# Patient Record
Sex: Female | Born: 1970 | Race: White | Hispanic: No | Marital: Married | Smoking: Former smoker
Health system: Southern US, Community
[De-identification: ages and names within clinical notes are randomized; demographics above are authoritative.]

## PROBLEM LIST (undated history)

## (undated) DIAGNOSIS — E785 Hyperlipidemia, unspecified: Secondary | ICD-10-CM

## (undated) DIAGNOSIS — E559 Vitamin D deficiency, unspecified: Secondary | ICD-10-CM

## (undated) DIAGNOSIS — F419 Anxiety disorder, unspecified: Secondary | ICD-10-CM

## (undated) DIAGNOSIS — I10 Essential (primary) hypertension: Secondary | ICD-10-CM

## (undated) DIAGNOSIS — F32A Depression, unspecified: Secondary | ICD-10-CM

## (undated) HISTORY — PX: CHOLECYSTECTOMY: SHX55

## (undated) HISTORY — DX: Hyperlipidemia, unspecified: E78.5

## (undated) HISTORY — PX: ABDOMINAL HYSTERECTOMY: SHX81

---

## 1996-03-07 HISTORY — PX: TUBAL LIGATION: SHX77

## 2001-06-29 ENCOUNTER — Encounter: Payer: Self-pay | Admitting: Family Medicine

## 2001-06-29 ENCOUNTER — Ambulatory Visit (HOSPITAL_COMMUNITY): Admission: RE | Admit: 2001-06-29 | Discharge: 2001-06-29 | Payer: Self-pay | Admitting: Family Medicine

## 2001-07-03 ENCOUNTER — Encounter: Payer: Self-pay | Admitting: General Surgery

## 2001-07-03 ENCOUNTER — Ambulatory Visit (HOSPITAL_COMMUNITY): Admission: RE | Admit: 2001-07-03 | Discharge: 2001-07-03 | Payer: Self-pay | Admitting: General Surgery

## 2005-08-26 ENCOUNTER — Ambulatory Visit (HOSPITAL_COMMUNITY): Admission: RE | Admit: 2005-08-26 | Discharge: 2005-08-26 | Payer: Self-pay | Admitting: Family Medicine

## 2006-06-14 ENCOUNTER — Emergency Department (HOSPITAL_COMMUNITY): Admission: EM | Admit: 2006-06-14 | Discharge: 2006-06-15 | Payer: Self-pay | Admitting: Emergency Medicine

## 2006-07-14 ENCOUNTER — Ambulatory Visit: Payer: Self-pay | Admitting: Obstetrics & Gynecology

## 2006-07-14 ENCOUNTER — Other Ambulatory Visit: Payer: Self-pay

## 2006-07-15 ENCOUNTER — Ambulatory Visit: Payer: Self-pay | Admitting: Obstetrics & Gynecology

## 2010-06-22 NOTE — H&P (Signed)
[facility]  Patient:    Margaret [name], Margaret [name] Visit Number: [id number] MRN: [id number]          Service Type: OUT Location: RAD Attending Physician:  Darlin Priestly Dictated by:   Franky Macho, M.D. Admit Date:  06/29/2001 Discharge Date: 06/29/2001   CC:         Colette Ribas, M.D.   History and Physical  AGE:  [age].  CHIEF COMPLAINT:  Cholecystitis, cholelithiasis.  HISTORY OF PRESENT ILLNESS:  The patient is a 40 year old white female who presents with right upper quadrant pain and nausea which have been intermittent over the past year.  She has right upper quadrant abdominal pain which radiates around to the right flank, nausea, bloating; minimal vomiting has been noted.  An ultrasound of the gallbladder was performed which revealed a single gallstones in the neck of the gallbladder with a mildly thickened gallbladder wall.  The common bile duct is noted to be at 6 mm.  She denies any fever, chills, or jaundice.  PAST MEDICAL HISTORY:  Unremarkable.  PAST SURGICAL HISTORY:  Tubal ligation.  CURRENT MEDICATIONS:  Prevacid.  ALLERGIES:  No known drug allergies.  REVIEW OF SYSTEMS:  Unremarkable.  PHYSICAL EXAMINATION:  GENERAL:  On physical examination, the patient is a well-developed, well-nourished white female in no acute distress.  VITAL SIGNS:  She is afebrile and vital signs are stable.  HEENT:  Examination reveals no scleral icterus.  LUNGS:  Lungs are clear to auscultation with equal breath sounds bilaterally.  HEART:  Examination reveals a regular rate and rhythm without S3, S4, or murmurs.  ABDOMEN:  The abdomen is soft with tenderness noted in the right upper quadrant to palpation.  No hepatosplenomegaly, masses, or herniae are identified.  IMPRESSION:  Cholecystitis, cholelithiasis.  PLAN:  The patient is scheduled to undergo a laparoscopic cholecystectomy on Jul 03, 2001.  The risks and benefits of  the procedure including bleeding, infection, hepatobiliary injury, and the possibility of an open procedure were fully explained to the patient, who gave informed consent. Dictated by:   Franky Macho, M.D. Attending Physician:  Darlin Priestly DD:  07/01/01 TD:  07/02/01 Job: [id number]

## 2010-06-22 NOTE — Op Note (Signed)
[facility]  Patient:    Margaret [name], Margaret [name] Visit Number: [id number] MRN: [id number]          Service Type: DSU Location: DAY Attending Physician:  Dalia Heading Dictated by:   Franky Macho, M.D. Proc. Date: 07/03/01 Admit Date:  07/03/2001   CC:         Dr. Phillips Odor   Operative Report  PREOPERATIVE DIAGNOSES:  Cholecystitis, cholelithiasis.  POSTOPERATIVE DIAGNOSES:  Cholecystitis, cholelithiasis.  PROCEDURE:  Laparoscopic cholecystectomy with cholangiograms.  SURGEON:  Franky Macho, M.D.  ASSISTANT:  Arna Snipe, M.D.  ANESTHESIA:  General endotracheal.  INDICATIONS FOR PROCEDURE:  The patient is a 40 year old white female who presents with biliary colic secondary to cholelithiasis. She also has a slightly dilated common bile duct at 6 mm. Her liver enzyme tests were within normal limits. The risks and benefits of the procedure including bleeding, infection, hepatobiliary injury and the possibility of an open procedure were fully explained to the patient, who gave informed consent.  DESCRIPTION OF PROCEDURE:  The patient was placed in the supine position. After induction of general endotracheal anesthesia, the abdomen was prepped and draped using the usual sterile technique with Betadine.  A supraumbilical incision was made down the fascia. A Veress needle was introduced into the abdominal cavity and confirmation of placement was done using the saline drop test. The abdomen was then insufflated to 16 mmHg pressure. An 11 mm trocar was introduced into the abdominal cavity under direct visualization without difficulty. The patient was placed in reverse Trendelenburg position and an additional 11 mm trocar was placed in the epigastric region and 5 mm trocars were placed in the right upper quadrant and right flank regions. The liver was inspected and noted to be within normal limits. The gallbladder was retracted superior and  laterally. The dissection was begun around the infundibulum of the gallbladder. The cystic duct was first identified. Its junction to the infundibulum fully identified. A single endoclip was placed proximally on the cystic duct. An incision was made in the cystic duct and a cholangiocatheter was then inserted. Under digital C-arm, cholangiograms were performed. The dye flowed freely into the duodenum. There was no hepatobiliary filling defect. The common bile duct did not appear to be significantly dilated. No choledocholithiasis was seen. The catheter was in the appropriate position in the cystic duct. The catheter was removed and additional clips were placed distally on the cystic duct and the cystic duct was divided. The cystic artery was likewise ligated and divided. The gallbladder was then freed away from the gallbladder fossa using Bovie electrocautery. The gallbladder was delivered through the epigastric trocar site using an endocatch bag without difficulty. Multiple stones were noted within the gallbladder lumen. The gallbladder fossa was inspected and no abnormal bleeding or bile leakage was noted. Surgicel was placed in the gallbladder fossa. The subhepatic space as well as right hepatic gutter were irrigated with normal saline. All fluid and air were then evacuated from the abdominal cavity prior to removal of the trocars.  All wounds were irrigated with normal saline. All wounds were injected with 0.5% Marcaine. The supraumbilical fascia as well as epigastric fascia reapproximated using an #0 Vicryl interrupted suture. All skin incisions were closed using 4-0 Vicryl subcuticular sutures. Steri-Strips and dry sterile dressings were applied.  All tape and needle counts were correct at the end of the procedure. The patient was extubated in the operating room and went back to the recovery room awake in stable condition. Complications  none. Specimen, gallbladder with stones.  Blood loss minimal. Dictated by:   Franky Macho, M.D. Attending Physician:  Dalia Heading DD:  07/03/01 TD:  07/04/01 Job: [id number]

## 2012-01-21 ENCOUNTER — Emergency Department (HOSPITAL_COMMUNITY)
Admission: EM | Admit: 2012-01-21 | Discharge: 2012-01-21 | Disposition: A | Discharge status: Home / Self Care | Payer: Self-pay | Attending: Emergency Medicine | Admitting: Emergency Medicine

## 2012-01-21 ENCOUNTER — Encounter (HOSPITAL_COMMUNITY): Payer: Self-pay

## 2012-01-21 ENCOUNTER — Emergency Department (HOSPITAL_COMMUNITY): Payer: Self-pay

## 2012-01-21 DIAGNOSIS — Z79899 Other long term (current) drug therapy: Secondary | ICD-10-CM | POA: Insufficient documentation

## 2012-01-21 DIAGNOSIS — M2550 Pain in unspecified joint: Secondary | ICD-10-CM | POA: Insufficient documentation

## 2012-01-21 DIAGNOSIS — F411 Generalized anxiety disorder: Secondary | ICD-10-CM | POA: Insufficient documentation

## 2012-01-21 DIAGNOSIS — IMO0001 Reserved for inherently not codable concepts without codable children: Secondary | ICD-10-CM | POA: Insufficient documentation

## 2012-01-21 DIAGNOSIS — F17200 Nicotine dependence, unspecified, uncomplicated: Secondary | ICD-10-CM | POA: Insufficient documentation

## 2012-01-21 DIAGNOSIS — M79605 Pain in left leg: Secondary | ICD-10-CM

## 2012-01-21 DIAGNOSIS — M79609 Pain in unspecified limb: Secondary | ICD-10-CM | POA: Insufficient documentation

## 2012-01-21 DIAGNOSIS — I10 Essential (primary) hypertension: Secondary | ICD-10-CM | POA: Insufficient documentation

## 2012-01-21 DIAGNOSIS — R079 Chest pain, unspecified: Secondary | ICD-10-CM | POA: Insufficient documentation

## 2012-01-21 HISTORY — DX: Anxiety disorder, unspecified: F41.9

## 2012-01-21 HISTORY — DX: Essential (primary) hypertension: I10

## 2012-01-21 LAB — TROPONIN I: Troponin I: <0.3 ng/mL (ref <0.30)

## 2012-01-21 LAB — CBC WITH DIFFERENTIAL/PLATELET
Basophils Absolute: 0 10*3/uL (ref 0.0–0.1)
Eosinophils Absolute: 0.1 10*3/uL (ref 0.0–0.7)
Eosinophils Relative: 1 % (ref 0–5)
Lymphocytes Relative: 28 % (ref 12–46)
MCH: 31.3 pg (ref 26.0–34.0)
MCV: 89.5 fL (ref 78.0–100.0)
Neutrophils Relative %: 66 % (ref 43–77)
Platelets: 231 10*3/uL (ref 150–400)
RDW: 13.1 % (ref 11.5–15.5)
WBC: 7.1 10*3/uL (ref 4.0–10.5)

## 2012-01-21 LAB — BASIC METABOLIC PANEL
Calcium: 10.3 mg/dL (ref 8.4–10.5)
GFR calc Af Amer: >90 mL/min (ref >90)
GFR calc non Af Amer: >90 mL/min (ref >90)
Potassium: 4.2 mEq/L (ref 3.5–5.1)
Sodium: 137 mEq/L (ref 135–145)

## 2012-01-21 LAB — PROTIME-INR: Prothrombin Time: 12.2 seconds (ref 11.6–15.2)

## 2012-01-21 MED ORDER — CYCLOBENZAPRINE HCL 10 MG PO TABS: 10.0000 mg | ORAL_TABLET | Freq: Three times a day (TID) | ORAL | Status: DC | PRN

## 2012-01-21 NOTE — ED Notes (Signed)
Bilateral LE pain, 10/10 sharp ache.  Faint bruising on tibia of R leg and lateral calf of L leg. Cannot recall any injury to legs.  Also c/o dyspnea w/mild exertion, chest tightness radiating to back which increases w/deep inhalation. Father died from DVT sequela.   Took one of husband's 7.5mg  Percocet for pain this morning w/minimal relief.

## 2012-01-21 NOTE — ED Provider Notes (Signed)
History     CSN: [id number]  Arrival date & time 01/21/12  1132   First MD Initiated Contact with Patient 01/21/12 1536      Chief Complaint  Patient presents with  . Leg Pain    (Consider location/radiation/quality/duration/timing/severity/associated sxs/prior treatment) HPI Comments: patient c/o bilateral LE pain and frequent bruising for several days.  States she has noticed several bruises to her legs but denies known injury.  States she has a "knot" to the front of her left lower leg over the area of one of the bruises.  Pain to the left leg is worse than right.  She denies known injury, bleeding disorders, hx of DVT, recent surgery, sedentary lifestyle, or any anticoagulants.  Patient also states she had a brief episode of sharp pain to the middle of her chest that radiated to her back that began just PTA. She reports some shortness of breath with onset of the pain, but symptoms have improved since onset.  She states that her father died from a DVT and she is afraid she may have one.    Patient is a 41 y.o. female presenting with leg pain. The history is provided by the patient.  Leg Pain  The incident occurred more than 2 days ago. There was no injury mechanism. The pain is present in the left leg and right leg. The quality of the pain is described as aching and throbbing. The pain is moderate. The pain has been constant since onset. Pertinent negatives include no numbness, no inability to bear weight, no loss of motion, no muscle weakness, no loss of sensation and no tingling. She reports no foreign bodies present. The symptoms are aggravated by activity. She has tried nothing for the symptoms. The treatment provided no relief.    Past Medical History  Diagnosis Date  . Hypertension   . Anxiety     Past Surgical History  Procedure Date  . Cholecystectomy   . Abdominal hysterectomy     No family history on file.  History  Substance Use Topics  . Smoking status: Current  Every Day Smoker  . Smokeless tobacco: Not on file  . Alcohol Use: No    OB History    Grav Para Term Preterm Abortions TAB SAB Ect Mult Living                  Review of Systems  Constitutional: Negative for fever, chills, activity change and appetite change.  HENT: Negative for neck pain and neck stiffness.   Respiratory: Negative for cough, chest tightness, shortness of breath and wheezing.   Cardiovascular: Positive for chest pain. Negative for palpitations and leg swelling.  Gastrointestinal: Negative for abdominal pain.  Musculoskeletal: Positive for myalgias and arthralgias. Negative for back pain and joint swelling.  Skin:       Bruising to both LE's  Neurological: Negative for dizziness, tingling, weakness, numbness and headaches.  Hematological: Negative for adenopathy. Bruises/bleeds easily.  All other systems reviewed and are negative.    Allergies  Review of patient's allergies indicates no known allergies.  Home Medications   Current Outpatient Rx  Name  Route  Sig  Dispense  Refill  . ALPRAZOLAM 1 MG PO TABS   Oral   Take 1 mg by mouth at bedtime as needed. Anxiety.         Marland Kitchen LOSARTAN POTASSIUM 50 MG PO TABS   Oral   Take 50 mg by mouth daily.  BP 142/83  Pulse 99  Temp 98.1 F (36.7 C) (Oral)  Resp 20  Ht 5\' 2"  (1.575 m)  Wt 131 lb 9 oz (59.676 kg)  BMI 24.06 kg/m2  SpO2 100%  Physical Exam  Nursing note and vitals reviewed. Constitutional: She is oriented to person, place, and time. She appears well-developed and well-nourished. No distress.  HENT:  Head: Normocephalic and atraumatic.  Neck: Normal range of motion. Neck supple.  Cardiovascular: Normal rate, regular rhythm, normal heart sounds and intact distal pulses.   No murmur heard. Pulmonary/Chest: Effort normal and breath sounds normal. No respiratory distress. She has no wheezes. She has no rales. She exhibits tenderness.  Abdominal: Soft. She exhibits no distension.  There is no tenderness.  Musculoskeletal: Normal range of motion. She exhibits tenderness. She exhibits no edema.       Left lower leg: She exhibits tenderness. She exhibits no bony tenderness, no swelling, no edema, no deformity and no laceration.       Legs:      Small bruise of the right anterior LE and medial knee.  Larger bruise to the anterior left LE.  ttp of the left posterior calf, pain reproduced with palpation and dorsiflexion of the left foot.  No erythema or edema. Pt has full ROM of LE's bilaterally. DP pulses are equal and brisk bilaterally, distal sensation intact.    Lymphadenopathy:    She has no cervical adenopathy.  Neurological: She is alert and oriented to person, place, and time. She exhibits normal muscle tone. Coordination normal.  Skin: Skin is warm and dry. No erythema.    ED Course  Procedures (including critical care time)  Results for orders placed during the hospital encounter of 01/21/12  CBC WITH DIFFERENTIAL      Component Value Range   WBC 7.1  4.0 - 10.5 K/uL   RBC 4.48  3.87 - 5.11 MIL/uL   Hemoglobin 14.0  12.0 - 15.0 g/dL   HCT 45.4  09.8 - 11.9 %   MCV 89.5  78.0 - 100.0 fL   MCH 31.3  26.0 - 34.0 pg   MCHC 34.9  30.0 - 36.0 g/dL   RDW 14.7  82.9 - 56.2 %   Platelets 231  150 - 400 K/uL   Neutrophils Relative 66  43 - 77 %   Neutro Abs 4.7  1.7 - 7.7 K/uL   Lymphocytes Relative 28  12 - 46 %   Lymphs Abs 2.0  0.7 - 4.0 K/uL   Monocytes Relative 5  3 - 12 %   Monocytes Absolute 0.4  0.1 - 1.0 K/uL   Eosinophils Relative 1  0 - 5 %   Eosinophils Absolute 0.1  0.0 - 0.7 K/uL   Basophils Relative 0  0 - 1 %   Basophils Absolute 0.0  0.0 - 0.1 K/uL  BASIC METABOLIC PANEL      Component Value Range   Sodium 137  135 - 145 mEq/L   Potassium 4.2  3.5 - 5.1 mEq/L   Chloride 106  96 - 112 mEq/L   CO2 25  19 - 32 mEq/L   Glucose, Bld 99  70 - 99 mg/dL   BUN 7  6 - 23 mg/dL   Creatinine, Ser 1.30  0.50 - 1.10 mg/dL   Calcium 86.5  8.4 - 78.4  mg/dL   GFR calc non Af Amer >90  >90 mL/min   GFR calc Af Amer >90  >90 mL/min  TROPONIN  I      Component Value Range   Troponin I <0.30  <0.30 ng/mL  PROTIME-INR      Component Value Range   Prothrombin Time 12.2  11.6 - 15.2 seconds   INR 0.91  0.00 - 1.49  APTT      Component Value Range   aPTT 30  24 - 37 seconds    US Venous Img Lower Unilateral Left  01/21/2012  *RADIOLOGY REPORT*  Clinical Data: Left leg pain, evaluate for DVT  LEFT LOWER EXTREMITY VENOUS DUPLEX ULTRASOUND  Technique:  Gray-scale sonography with graded compression, as well as color Doppler and duplex ultrasound were performed to evaluate the deep venous system of the lower extremity from the level of the common femoral vein through the popliteal and proximal calf veins. Spectral Doppler was utilized to evaluate flow at rest and with distal augmentation maneuvers.  Comparison:  None.  Findings:  Normal compressibility of the common femoral, superficial femoral, and popliteal veins is demonstrated, as well as the visualized proximal calf veins.  No filling defects to suggest DVT on grayscale or color Doppler imaging.  Doppler waveforms show normal direction of venous flow, normal respiratory phasicity and response to augmentation.  IMPRESSION: No evidence of deep vein thrombosis within the left lower extremity.   Original Report Authenticated By: Tacey Ruiz, MD         MDM      Date: 01/21/2012  Rate:95  Rhythm: normal sinus rhythm  QRS Axis: normal  Intervals: normal  ST/T Wave abnormalities: normal  Conduction Disutrbances:none  Narrative Interpretation:   Old EKG Reviewed: none available    EKG read by Dr. Shelly Coss  1:30 pm  Pt with ttp of the posterior LLE., no hx of previous DVT, erythema or edema of the LE.  DP pulses are equal and brisk.  I have discussed pt hx and care plan with EDP. Will order venous US and labs   3:10 pm  patient is feeling better. Ambulates with a steady gait. Vital signs  have normalized during ED stay patient is not tachypneic, hypoxic or tachycardic at this time.  No [place] evidence for DVT.  Wells score for PE is 0,  PERC score is negative. Clinical suspicion for PE is low.  Pt agrees to close f/u with her PMD in 1-2 days or to return to ER if her sx's worsen     3:40 pm  Patient seen by EDP, Dr. Adriana Simas, care plan discussed.  Prescribed: flexeril   Aijalon Demuro L. B[place], [place] 01/22/12 2034

## 2012-01-21 NOTE — ED Notes (Signed)
Complain of knot on left lower leg and pain in calf

## 2012-01-21 NOTE — ED Notes (Signed)
Patient with no complaints at this time. Respirations even and unlabored. Skin warm/dry. Discharge instructions reviewed with patient at this time. Patient given opportunity to voice concerns/ask questions. Patient discharged at this time and left Emergency Department with steady gait.   

## 2012-01-23 NOTE — ED Provider Notes (Signed)
Medical screening examination/treatment/procedure(s) were conducted as a shared visit with non-physician practitioner(s) and myself.  I personally evaluated the patient during the encounter.  No clinical evidence to suggest DVT or pulmonary embolus  Donnetta Hutching, MD 01/23/12 [phone]

## 2014-01-13 ENCOUNTER — Other Ambulatory Visit (HOSPITAL_COMMUNITY): Payer: Self-pay | Admitting: Endocrinology

## 2014-01-13 DIAGNOSIS — E0590 Thyrotoxicosis, unspecified without thyrotoxic crisis or storm: Secondary | ICD-10-CM

## 2014-01-18 ENCOUNTER — Encounter (HOSPITAL_COMMUNITY): Payer: BC Managed Care – PPO

## 2014-02-03 ENCOUNTER — Encounter (HOSPITAL_COMMUNITY)
Admission: RE | Admit: 2014-02-03 | Discharge: 2014-02-03 | Disposition: A | Discharge status: Home / Self Care | Payer: BC Managed Care – PPO | Source: Ambulatory Visit | Attending: Endocrinology | Admitting: Endocrinology

## 2014-02-03 DIAGNOSIS — E0590 Thyrotoxicosis, unspecified without thyrotoxic crisis or storm: Secondary | ICD-10-CM | POA: Insufficient documentation

## 2014-02-03 MED ORDER — TECHNETIUM TC 99M SESTAMIBI GENERIC - CARDIOLITE
25.0000 | Freq: Once | INTRAVENOUS | Status: AC | PRN
  Administered 2014-02-03: 25 via INTRAVENOUS

## 2014-02-23 ENCOUNTER — Other Ambulatory Visit (INDEPENDENT_AMBULATORY_CARE_PROVIDER_SITE_OTHER): Payer: Self-pay | Admitting: *Deleted

## 2014-02-23 ENCOUNTER — Other Ambulatory Visit (INDEPENDENT_AMBULATORY_CARE_PROVIDER_SITE_OTHER): Payer: Self-pay | Admitting: Surgery

## 2014-02-23 DIAGNOSIS — E042 Nontoxic multinodular goiter: Secondary | ICD-10-CM

## 2014-02-23 DIAGNOSIS — E213 Hyperparathyroidism, unspecified: Secondary | ICD-10-CM

## 2014-02-28 ENCOUNTER — Ambulatory Visit
Admission: RE | Admit: 2014-02-28 | Discharge: 2014-02-28 | Disposition: A | Discharge status: Home / Self Care | Payer: BLUE CROSS/BLUE SHIELD | Source: Ambulatory Visit | Attending: Surgery | Admitting: Surgery

## 2014-02-28 ENCOUNTER — Other Ambulatory Visit: Payer: Self-pay

## 2014-03-08 ENCOUNTER — Emergency Department (HOSPITAL_COMMUNITY)
Admission: EM | Admit: 2014-03-08 | Discharge: 2014-03-08 | Disposition: A | Discharge status: Home / Self Care | Payer: BLUE CROSS/BLUE SHIELD | Attending: Emergency Medicine | Admitting: Emergency Medicine

## 2014-03-08 ENCOUNTER — Encounter (HOSPITAL_COMMUNITY): Payer: Self-pay | Admitting: Emergency Medicine

## 2014-03-08 DIAGNOSIS — Z79899 Other long term (current) drug therapy: Secondary | ICD-10-CM | POA: Diagnosis not present

## 2014-03-08 DIAGNOSIS — F419 Anxiety disorder, unspecified: Secondary | ICD-10-CM | POA: Diagnosis not present

## 2014-03-08 DIAGNOSIS — I10 Essential (primary) hypertension: Secondary | ICD-10-CM | POA: Insufficient documentation

## 2014-03-08 DIAGNOSIS — Z8639 Personal history of other endocrine, nutritional and metabolic disease: Secondary | ICD-10-CM | POA: Diagnosis not present

## 2014-03-08 DIAGNOSIS — R002 Palpitations: Secondary | ICD-10-CM | POA: Diagnosis not present

## 2014-03-08 DIAGNOSIS — R000 Tachycardia, unspecified: Secondary | ICD-10-CM | POA: Diagnosis present

## 2014-03-08 DIAGNOSIS — Z720 Tobacco use: Secondary | ICD-10-CM | POA: Insufficient documentation

## 2014-03-08 HISTORY — DX: Hypercalcemia: E83.52

## 2014-03-08 HISTORY — DX: Vitamin D deficiency, unspecified: E55.9

## 2014-03-08 LAB — COMPREHENSIVE METABOLIC PANEL
ALBUMIN: 3.7 g/dL (ref 3.5–5.2)
ALK PHOS: 61 U/L (ref 39–117)
ALT: 23 U/L (ref 0–35)
AST: 21 U/L (ref 0–37)
BUN: 10 mg/dL (ref 6–23)
CO2: 27 mmol/L (ref 19–32)
CREATININE: 0.83 mg/dL (ref 0.50–1.10)
Calcium: 10.1 mg/dL (ref 8.4–10.5)
Chloride: 107 mmol/L (ref 96–112)
GFR, EST NON AFRICAN AMERICAN: 85 mL/min — AB (ref >90)
Glucose, Bld: 96 mg/dL (ref 70–99)
POTASSIUM: 4.1 mmol/L (ref 3.5–5.1)
Sodium: 135 mmol/L (ref 135–145)
Total Bilirubin: 0.4 mg/dL (ref 0.3–1.2)
Total Protein: 6.8 g/dL (ref 6.0–8.3)

## 2014-03-08 LAB — TROPONIN I: Troponin I: <0.03 ng/mL (ref <0.031)

## 2014-03-08 LAB — CBC WITH DIFFERENTIAL/PLATELET
BASOS ABS: 0 10*3/uL (ref 0.0–0.1)
BASOS PCT: 0 % (ref 0–1)
EOS ABS: 0.1 10*3/uL (ref 0.0–0.7)
EOS PCT: 1 % (ref 0–5)
HEMATOCRIT: 39.5 % (ref 36.0–46.0)
Hemoglobin: 13.8 g/dL (ref 12.0–15.0)
LYMPHS ABS: 1.9 10*3/uL (ref 0.7–4.0)
Lymphocytes Relative: 21 % (ref 12–46)
MCH: 30.7 pg (ref 26.0–34.0)
MCHC: 34.9 g/dL (ref 30.0–36.0)
MCV: 87.8 fL (ref 78.0–100.0)
MONO ABS: 0.5 10*3/uL (ref 0.1–1.0)
MONOS PCT: 5 % (ref 3–12)
NEUTROS PCT: 73 % (ref 43–77)
Neutro Abs: 6.7 10*3/uL (ref 1.7–7.7)
Platelets: 231 10*3/uL (ref 150–400)
RBC: 4.5 MIL/uL (ref 3.87–5.11)
RDW: 12.2 % (ref 11.5–15.5)
WBC: 9.3 10*3/uL (ref 4.0–10.5)

## 2014-03-08 LAB — MAGNESIUM: Magnesium: 2 mg/dL (ref 1.5–2.5)

## 2014-03-08 LAB — LIPASE, BLOOD: LIPASE: 24 U/L (ref 11–59)

## 2014-03-08 MED ORDER — IBUPROFEN 800 MG PO TABS
800.0000 mg | ORAL_TABLET | Freq: Once | ORAL | Status: AC
  Administered 2014-03-08: 800 mg via ORAL
  Filled 2014-03-08: qty 1

## 2014-03-08 MED ORDER — ACETAMINOPHEN 500 MG PO TABS
1000.0000 mg | ORAL_TABLET | Freq: Once | ORAL | Status: AC
  Administered 2014-03-08: 1000 mg via ORAL
  Filled 2014-03-08: qty 2

## 2014-03-08 NOTE — ED Provider Notes (Signed)
CSN: [id number]     Arrival date & time 03/08/14  [id number] History  This chart was scribed for Margaret HutchingBrian Zonia Caplin, MD by Milly JakobJohn Lee Graves, ED Scribe. The patient was seen in room APA06/APA06. Patient's care was started at 8:34 AM.   Chief Complaint  Patient presents with  . Generalized Body Aches  . Tachycardia   The history is provided by the patient. No language interpreter was used.   HPI Comments: Rogelio SeenStephanie Mervin is a 44 y.o. female with a history of HTN, and hypercalcemia who presents to the Emergency Department complaining of constant, aching, RUQ abdominal pain which began this morning. She additionally reports a rapid heart rate, nausea, and a headache. She denies taking medication for this at home. She denies SOB. She reports taking 50,000 units of vitamin D per week for her hypercalcemia. She states that her hypercalcemia was diagnosed on August 15th after routine blood work, and she had a negative workup of her parathyroid by ultrasound done by Dr. Simmie DaviesGurgin in [place]. She reports a history of cholecystectomy and hysterectomy. She does not drink alcohol.  PCP: Cassell SmilesFUSCO,LAWRENCE J., MD   Past Medical History  Diagnosis Date  . Hypertension   . Anxiety   . Vitamin D deficiency   . Hypercalcemia   . Hypercalcemia    Past Surgical History  Procedure Laterality Date  . Cholecystectomy    . Abdominal hysterectomy     No family history on file. History  Substance Use Topics  . Smoking status: Current Every Day Smoker -- 0.50 packs/day    Types: Cigarettes  . Smokeless tobacco: Not on file  . Alcohol Use: No   OB History    No data available     Review of Systems  Cardiovascular: Positive for palpitations.  Gastrointestinal: Positive for abdominal pain.   A complete 10 system review of systems was obtained and all systems are negative except as noted in the HPI and PMH.   Allergies  Review of patient's allergies indicates no known allergies.  Home Medications   Prior to  Admission medications   Medication Sig Start Date End Date Taking? Authorizing Provider  ALPRAZolam Prudy Feeler(XANAX) 1 MG tablet Take 1 mg by mouth 4 (four) times daily as needed for sleep. Anxiety.   Yes Historical Provider, MD  buPROPion (WELLBUTRIN XL) 300 MG 24 hr tablet Take 300 mg by mouth daily. 02/17/14  Yes Historical Provider, MD  losartan (COZAAR) 50 MG tablet Take 50 mg by mouth daily.   Yes Historical Provider, MD  Vitamin D, Ergocalciferol, (DRISDOL) 50000 UNITS CAPS capsule Take 50,000 Units by mouth every 7 (seven) days. 02/23/14  Yes Historical Provider, MD  cyclobenzaprine (FLEXERIL) 10 MG tablet Take 1 tablet (10 mg total) by mouth 3 (three) times daily as needed for muscle spasms. Patient not taking: Reported on 03/08/2014 01/21/12   Tammy L. Triplett, PA-C   Triage Vitals: BP 153/92 mmHg  Pulse 91  Temp(Src) 98.6 F (37 C) (Oral)  Resp 15  Ht 5\' 2"  (1.575 m)  Wt 148 lb (67.132 kg)  BMI 27.06 kg/m2  SpO2 98% Physical Exam  Constitutional: She is oriented to person, place, and time. She appears well-developed and well-nourished.  HENT:  Head: Normocephalic and atraumatic.  Mouth/Throat: Oropharynx is clear and moist.  Eyes: Conjunctivae and EOM are normal. Pupils are equal, round, and reactive to light.  Neck: Normal range of motion. Neck supple.  Cardiovascular: Normal rate, regular rhythm and normal heart sounds.   No murmur heard.  Pulmonary/Chest: Effort normal and breath sounds normal. No respiratory distress. She has no wheezes. She has no rales. She exhibits no tenderness.  Abdominal: Soft. Bowel sounds are normal.  Musculoskeletal: Normal range of motion.  Neurological: She is alert and oriented to person, place, and time.  Skin: Skin is warm and dry.  Psychiatric: She has a normal mood and affect. Her behavior is normal.  Nursing note and vitals reviewed.   ED Course  Procedures (including critical care time) DIAGNOSTIC STUDIES: Oxygen Saturation is 98% on room  air, normal by my interpretation.    COORDINATION OF CARE: 8:38 AM-Discussed treatment plan which includes lab work with pt at bedside and pt agreed to plan.   Results for orders placed or performed during the hospital encounter of 03/08/14  Comprehensive metabolic panel  Result Value Ref Range   Sodium 135 135 - 145 mmol/L   Potassium 4.1 3.5 - 5.1 mmol/L   Chloride 107 96 - 112 mmol/L   CO2 27 19 - 32 mmol/L   Glucose, Bld 96 70 - 99 mg/dL   BUN 10 6 - 23 mg/dL   Creatinine, Ser 9.56 0.50 - 1.10 mg/dL   Calcium 21.3 8.4 - 08.6 mg/dL   Total Protein 6.8 6.0 - 8.3 g/dL   Albumin 3.7 3.5 - 5.2 g/dL   AST 21 0 - 37 U/L   ALT 23 0 - 35 U/L   Alkaline Phosphatase 61 39 - 117 U/L   Total Bilirubin 0.4 0.3 - 1.2 mg/dL   GFR calc non Af Amer 85 (L) >90 mL/min   GFR calc Af Amer >90 >90 mL/min   Anion gap NOT CALCULATED 5 - 15  CBC with Differential/Platelet  Result Value Ref Range   WBC 9.3 4.0 - 10.5 K/uL   RBC 4.50 3.87 - 5.11 MIL/uL   Hemoglobin 13.8 12.0 - 15.0 g/dL   HCT 57.8 46.9 - 62.9 %   MCV 87.8 78.0 - 100.0 fL   MCH 30.7 26.0 - 34.0 pg   MCHC 34.9 30.0 - 36.0 g/dL   RDW 52.8 41.3 - 24.4 %   Platelets 231 150 - 400 K/uL   Neutrophils Relative % 73 43 - 77 %   Neutro Abs 6.7 1.7 - 7.7 K/uL   Lymphocytes Relative 21 12 - 46 %   Lymphs Abs 1.9 0.7 - 4.0 K/uL   Monocytes Relative 5 3 - 12 %   Monocytes Absolute 0.5 0.1 - 1.0 K/uL   Eosinophils Relative 1 0 - 5 %   Eosinophils Absolute 0.1 0.0 - 0.7 K/uL   Basophils Relative 0 0 - 1 %   Basophils Absolute 0.0 0.0 - 0.1 K/uL  Lipase, blood  Result Value Ref Range   Lipase 24 11 - 59 U/L  Magnesium  Result Value Ref Range   Magnesium 2.0 1.5 - 2.5 mg/dL  Troponin I  Result Value Ref Range   Troponin I <0.03 <0.031 ng/mL   US Soft Tissue Head/neck  02/28/2014   CLINICAL DATA:  thyromegaly on physical exam, hyperparathyroidism, elevated calcium  EXAM: THYROID ULTRASOUND  TECHNIQUE: Ultrasound examination of the  thyroid gland and adjacent soft tissues was performed.  COMPARISON:  Scintigraphy 02/03/2014  FINDINGS: Right thyroid lobe  Measurements: 61 x 16 x 21 mm. Single 7 x 4 x 3 mm hypoechoic nodule, mid lobe  Left thyroid lobe  Measurements: 54 x 13 x 18 mm.  2 mm cyst, superior pole  Isthmus  Thickness: 5 mm.  8 x 5 x 8 mm hypoechoic nodule, left of midline.  Lymphadenopathy  None visualized.  IMPRESSION: 1. Thyromegaly with 3 small nodules. Findings do not meet current consensus criteria for biopsy. Follow-up by clinical exam is recommended. If patient has known risk factors for thyroid carcinoma, consider follow-up ultrasound in 12 months. If patient is clinically hyperthyroid, consider nuclear medicine thyroid uptake and scan. This recommendation follows the consensus statement: Management of Thyroid Nodules Detected as [place]: Society of Radiologists in Ultrasound Consensus Conference Statement. Radiology 2005; [id number].   Electronically Signed   By: Oley Balm M.D.   On: 02/28/2014 09:29      EKG Interpretation   Date/Time:  Tuesday March 08 2014 08:00:35 EST Ventricular Rate:  87 PR Interval:  136 QRS Duration: 91 QT Interval:  369 QTC Calculation: 444 R Axis:   79 Text Interpretation:  Sinus rhythm Baseline wander in lead(s) V4 V5  Confirmed by Amandalynn Pitz  MD, Monisha Siebel ([id number]) on 03/08/2014 8:06:55 AM      MDM   Final diagnoses:  Palpitations   Normal physical exam. EKG, troponin, calcium, magnesium all normal. Patient has primary care follow-up. She is feeling much better at discharge.  I personally performed the services described in this documentation, which was scribed in my presence. The recorded information has been reviewed and is accurate.    Margaret Hutching, MD 03/08/14 [phone]

## 2014-03-08 NOTE — ED Notes (Signed)
Patient with c/o generalized body cramping and joint aching x 3 days. States she has felt her heart "racing" today.

## 2014-03-08 NOTE — ED Notes (Signed)
Patient with no complaints at this time. Respirations even and unlabored. Skin warm/dry. Discharge instructions reviewed with patient at this time. Patient given opportunity to voice concerns/ask questions. Patient discharged at this time and left Emergency Department with steady gait.   

## 2014-03-08 NOTE — Discharge Instructions (Signed)
Tests were normal including calcium and EKG. Follow-up your primary care doctor.

## 2014-03-08 NOTE — ED Notes (Signed)
MD at bedside. 

## 2014-10-21 ENCOUNTER — Emergency Department (HOSPITAL_COMMUNITY)
Admission: EM | Admit: 2014-10-21 | Discharge: 2014-10-21 | Disposition: A | Discharge status: Home / Self Care | Payer: BLUE CROSS/BLUE SHIELD | Attending: Emergency Medicine | Admitting: Emergency Medicine

## 2014-10-21 ENCOUNTER — Encounter (HOSPITAL_COMMUNITY): Payer: Self-pay

## 2014-10-21 ENCOUNTER — Emergency Department (HOSPITAL_COMMUNITY): Payer: BLUE CROSS/BLUE SHIELD

## 2014-10-21 DIAGNOSIS — Z79899 Other long term (current) drug therapy: Secondary | ICD-10-CM | POA: Insufficient documentation

## 2014-10-21 DIAGNOSIS — E559 Vitamin D deficiency, unspecified: Secondary | ICD-10-CM | POA: Insufficient documentation

## 2014-10-21 DIAGNOSIS — Z720 Tobacco use: Secondary | ICD-10-CM | POA: Insufficient documentation

## 2014-10-21 DIAGNOSIS — R2241 Localized swelling, mass and lump, right lower limb: Secondary | ICD-10-CM | POA: Insufficient documentation

## 2014-10-21 DIAGNOSIS — I10 Essential (primary) hypertension: Secondary | ICD-10-CM | POA: Insufficient documentation

## 2014-10-21 DIAGNOSIS — M7989 Other specified soft tissue disorders: Secondary | ICD-10-CM

## 2014-10-21 DIAGNOSIS — F419 Anxiety disorder, unspecified: Secondary | ICD-10-CM | POA: Insufficient documentation

## 2014-10-21 LAB — CBC WITH DIFFERENTIAL/PLATELET
BASOS ABS: 0 10*3/uL (ref 0.0–0.1)
BASOS PCT: 0 %
EOS PCT: 1 %
Eosinophils Absolute: 0.1 10*3/uL (ref 0.0–0.7)
HEMATOCRIT: 41.7 % (ref 36.0–46.0)
Hemoglobin: 14.5 g/dL (ref 12.0–15.0)
Lymphocytes Relative: 26 %
Lymphs Abs: 2.1 10*3/uL (ref 0.7–4.0)
MCH: 30.2 pg (ref 26.0–34.0)
MCHC: 34.8 g/dL (ref 30.0–36.0)
MCV: 86.9 fL (ref 78.0–100.0)
MONO ABS: 0.4 10*3/uL (ref 0.1–1.0)
MONOS PCT: 6 %
NEUTROS ABS: 5.4 10*3/uL (ref 1.7–7.7)
Neutrophils Relative %: 67 %
PLATELETS: 248 10*3/uL (ref 150–400)
RBC: 4.8 MIL/uL (ref 3.87–5.11)
RDW: 12.7 % (ref 11.5–15.5)
WBC: 8 10*3/uL (ref 4.0–10.5)

## 2014-10-21 LAB — BASIC METABOLIC PANEL
Anion gap: 4 — ABNORMAL LOW (ref 5–15)
BUN: 7 mg/dL (ref 6–20)
CALCIUM: 9.8 mg/dL (ref 8.9–10.3)
CO2: 28 mmol/L (ref 22–32)
CREATININE: 0.64 mg/dL (ref 0.44–1.00)
Chloride: 107 mmol/L (ref 101–111)
GLUCOSE: 97 mg/dL (ref 65–99)
Potassium: 3.9 mmol/L (ref 3.5–5.1)
Sodium: 139 mmol/L (ref 135–145)

## 2014-10-21 LAB — PROTIME-INR
INR: 0.98 (ref 0.00–1.49)
Prothrombin Time: 13.2 seconds (ref 11.6–15.2)

## 2014-10-21 MED ORDER — HYDROCODONE-ACETAMINOPHEN 5-325 MG PO TABS: 1.0000 | ORAL_TABLET | ORAL | Status: DC | PRN

## 2014-10-21 NOTE — ED Provider Notes (Signed)
CSN: [id number]     Arrival date & time 10/21/14  0931 History   First MD Initiated Contact with Patient 10/21/14 [id number]     Chief Complaint  Patient presents with  . Leg Pain     (Consider location/radiation/quality/duration/timing/severity/associated sxs/prior Treatment) The history is provided by the patient.   Margaret [name] is a 44 y.o. female presenting with right calf pain and swelling which started one week ago.  She denies trauma, muscle spasm or history of similar symptoms and has had no recent long periods of rest.  She does describe a strong family history of blood clots (father, grandmother and aunt).  She denies chest pain, sob, fevers or chills and has had no skin changes, but states the calf has felt hot intermittently.  She has taken ibuprofen without relief of pain.  She called her pcp who advised she come here for evaluation.  Past Medical History  Diagnosis Date  . Hypertension   . Anxiety   . Vitamin D deficiency   . Hypercalcemia   . Hypercalcemia    Past Surgical History  Procedure Laterality Date  . Cholecystectomy    . Abdominal hysterectomy     No family history on file. Social History  Substance Use Topics  . Smoking status: Current Every Day Smoker -- 0.50 packs/day    Types: Cigarettes  . Smokeless tobacco: None  . Alcohol Use: No   OB History    No data available     Review of Systems  Constitutional: Negative for fever.  HENT: Negative for congestion and sore throat.   Eyes: Negative.   Respiratory: Negative for chest tightness and shortness of breath.   Cardiovascular: Negative for chest pain and palpitations.  Gastrointestinal: Negative for nausea and abdominal pain.  Genitourinary: Negative.   Musculoskeletal: Positive for myalgias. Negative for joint swelling, arthralgias and neck pain.  Skin: Negative.  Negative for color change, rash and wound.  Neurological: Negative for dizziness, weakness, light-headedness, numbness and  headaches.  Psychiatric/Behavioral: Negative.       Allergies  Review of patient's allergies indicates no known allergies.  Home Medications   Prior to Admission medications   Medication Sig Start Date End Date Taking? Authorizing Aleister Lady  ALPRAZolam Prudy Feeler) 1 MG tablet Take 1-2 mg by mouth 4 (four) times daily as needed for anxiety or sleep. Anxiety.   Yes Historical Riyansh Gerstner, MD  ibuprofen (ADVIL,MOTRIN) 200 MG tablet Take 400 mg by mouth every 6 (six) hours as needed for moderate pain.   Yes Historical Monte Zinni, MD  losartan (COZAAR) 50 MG tablet Take 50 mg by mouth daily.   Yes Historical Travaughn Vue, MD  Vitamin D, Ergocalciferol, (DRISDOL) 50000 UNITS CAPS capsule Take 50,000 Units by mouth every 7 (seven) days. Take on Sunday. 02/23/14  Yes Historical Alsha Meland, MD  HYDROcodone-acetaminophen (NORCO/VICODIN) 5-325 MG per tablet Take 1 tablet by mouth every 4 (four) hours as needed. 10/21/14   Burgess Amor, PA-C   BP 163/101 mmHg  Pulse 84  Temp(Src) 97.8 F (36.6 C) (Oral)  Resp 16  Ht  (1.575 m)  Wt 150 lb (68.04 kg)  BMI 27.43 kg/m2  SpO2 97% Physical Exam  Constitutional: She appears well-developed and well-nourished.  HENT:  Head: Normocephalic and atraumatic.  Eyes: Conjunctivae are normal.  Neck: Normal range of motion.  Cardiovascular: Normal rate, regular rhythm, normal heart sounds and intact distal pulses.   Dorsalis pedal pulses are equal.  Pulmonary/Chest: Effort normal and breath sounds normal. She has no wheezes.  Abdominal: Soft. Bowel sounds are normal. There is no tenderness.  Musculoskeletal: Normal range of motion. She exhibits edema and tenderness.  Right calf and anterior tibia with visible increased edema, trace pitting anterior.  No palpable cords, no erythema or red streaking.  Sensation intact. Skin intact.  Neurological: She is alert.  Skin: Skin is warm and dry.  Psychiatric: She has a normal mood and affect.  Nursing note and vitals  reviewed.   ED Course  Procedures (including critical care time) Labs Review Labs Reviewed  BASIC METABOLIC PANEL - Abnormal; Notable for the following:    Anion gap 4 (*)    All other components within normal limits  CBC WITH DIFFERENTIAL/PLATELET  PROTIME-INR    Imaging Review US Venous Img Lower Unilateral Right  10/21/2014   CLINICAL DATA:  Calf swelling and pain.  Symptoms for 1 week.  EXAM: RIGHT LOWER EXTREMITY VENOUS DOPPLER ULTRASOUND  TECHNIQUE: Gray-scale sonography with graded compression, as well as color Doppler and duplex ultrasound were performed to evaluate the lower extremity deep venous systems from the level of the common femoral vein and including the common femoral, femoral, profunda femoral, popliteal and calf veins including the posterior tibial, peroneal and gastrocnemius veins when visible. The superficial great saphenous vein was also interrogated. Spectral Doppler was utilized to evaluate flow at rest and with distal augmentation maneuvers in the common femoral, femoral and popliteal veins.  COMPARISON:  None.  FINDINGS: Contralateral Common Femoral Vein: Respiratory phasicity is normal and symmetric with the symptomatic side. No evidence of thrombus. Normal compressibility.  Common Femoral Vein: No evidence of thrombus. Normal compressibility, respiratory phasicity and response to augmentation.  Saphenofemoral Junction: No evidence of thrombus. Normal compressibility and flow on color Doppler imaging.  Profunda Femoral Vein: No evidence of thrombus. Normal compressibility and flow on color Doppler imaging.  Femoral Vein: No evidence of thrombus. Normal compressibility, respiratory phasicity and response to augmentation.  Popliteal Vein: No evidence of thrombus. Normal compressibility, respiratory phasicity and response to augmentation.  Calf Veins: No evidence of thrombus. Normal compressibility and flow on color Doppler imaging.  Superficial Great Saphenous Vein: No  evidence of thrombus. Normal compressibility and flow on color Doppler imaging.  Venous Reflux:  None.  Other Findings:  None.  IMPRESSION: No evidence of RIGHT lower extremity deep venous thrombosis.   Electronically Signed   By: Elsie Stain M.D.   On: 10/21/2014 11:11   I have personally reviewed and evaluated these images and lab results as part of my medical decision-making.   EKG Interpretation None      MDM   Final diagnoses:  Calf swelling    Patients  labs reviewed.  Radiological studies were viewed, interpreted and considered during the medical decision making and disposition process. I agree with radiologists reading.  Results were also discussed with patient.   Patient was advised given her family history to take an aspirin daily.  Also discussed elevation and warm compresses to her calf several times daily.  Follow up with her PCP for recheck in one week.  Discussed patient and results with Dr. Fayrene Fearing.  Patient may need to have repeat ultrasound if her symptoms persist, but there is no evidence for DVT at this time.  Patient's blood pressure is elevated today, she was encouraged to avoid missing doses of her blood pressure medication.  She was prescribed hydrocodone for pain relief.  Cautioned regarding sedation.   Burgess Amor, PA-C 10/21/14 1717  Rolland Porter, MD 10/22/14 [phone]

## 2014-10-21 NOTE — ED Notes (Signed)
Pt c/o pain in r calf x 1 week.  Denies injury.  Ankle swollen.  Pedal pulse present, capillary refill wnl.

## 2014-10-21 NOTE — Discharge Instructions (Signed)
Peripheral Edema You have swelling in your legs (peripheral edema). This swelling is due to excess accumulation of salt and water in your body. Edema may be a sign of heart, kidney or liver disease, or a side effect of a medication. It may also be due to problems in the leg veins. Elevating your legs and using special support stockings may be very helpful, if the cause of the swelling is due to poor venous circulation. Avoid long periods of standing, whatever the cause. Treatment of edema depends on identifying the cause. Chips, pretzels, pickles and other salty foods should be avoided. Restricting salt in your diet is almost always needed. Water pills (diuretics) are often used to remove the excess salt and water from your body via urine. These medicines prevent the kidney from reabsorbing sodium. This increases urine flow. Diuretic treatment may also result in lowering of potassium levels in your body. Potassium supplements may be needed if you have to use diuretics daily. Daily weights can help you keep track of your progress in clearing your edema. You should call your caregiver for follow up care as recommended. SEEK IMMEDIATE MEDICAL CARE IF:   You have increased swelling, pain, redness, or heat in your legs.  You develop shortness of breath, especially when lying down.  You develop chest or abdominal pain, weakness, or fainting.  You have a fever. Document Released: 02/29/2004 Document Revised: 04/15/2011 Document Reviewed: 02/08/2009 [facility] Patient Information 2015 [place], [place]. This information is not intended to replace advice given to you by your health care provider. Make sure you discuss any questions you have with your health care provider.   Your lab tests and ultrasound today are normal with no sign of a blood clot or superficial thrombophlebitis which is a condition that can also cause similar symptoms.  I suggest taking an aspirin daily, applying a heating pad 20 minutes 3  times daily,  Elevating your leg and following up with Dr. Sherwood Gambler in 1 week if not resolved.

## 2015-11-27 IMAGING — NM NM PARATHYROID W/ SPECT
4 series · 24 of 24 positions shown · non-contrast
Comparison: None.

CLINICAL DATA: Hyperparathyroidism. Evaluate for parathyroid
adenoma.

EXAM:
NM PARATHYROID SCINTIGRAPHY AND SPECT IMAGING
TECHNIQUE: Following intravenous administration of radiopharmaceutical, early
and 2-hour delayed planar images were obtained in the anterior
projection. Delayed triplanar SPECT images were also obtained at 2
hours.
RADIOPHARMACEUTICALS:  25.0 [id number] Sestamibi IV

[Series 1: pa parathyroid · 4.7mm · 4.75mm/px · 6 of 91 frames shown (1 of 4)]
[frame 8/91]
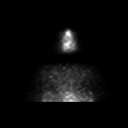
[frame 23/91]
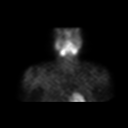
[frame 38/91]
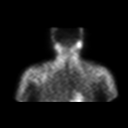
[frame 53/91]
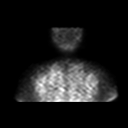
[frame 68/91]
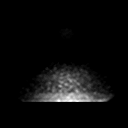
[frame 84/91]
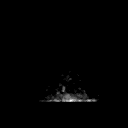

[Series 1: pa parathyroid · 4.7mm · 4.75mm/px · 6 of 91 frames shown (2 of 4)]
[frame 8/91]
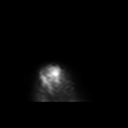
[frame 23/91]
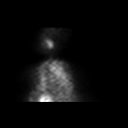
[frame 38/91]
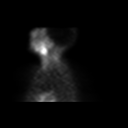
[frame 53/91]
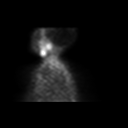
[frame 68/91]
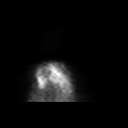
[frame 84/91]
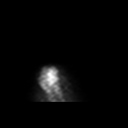

[Series 1: pa parathyroid · 4.7mm · 4.75mm/px · 6 of 91 frames shown (3 of 4)]
[frame 8/91]
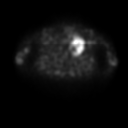
[frame 23/91]
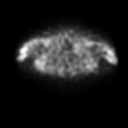
[frame 38/91]
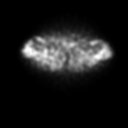
[frame 53/91]
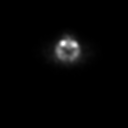
[frame 68/91]
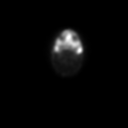
[frame 84/91]
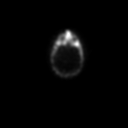

[Series 1: pa parathyroid · 4.75mm/px · 6 of 64 frames shown (4 of 4)]
[frame 6/64]
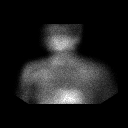
[frame 16/64]
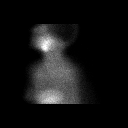
[frame 27/64]
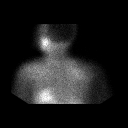
[frame 38/64]
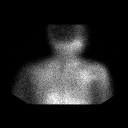
[frame 48/64]
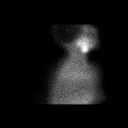
[frame 59/64]
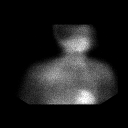

[24 of 24 positions shown; findings below may reference images not displayed]

FINDINGS: On the early images there is uniform tracer uptake by the thyroid
gland. On the washout images there is no abnormal persistent focus
of increased uptake to suggest parathyroid adenoma.
IMPRESSION: Examination is negative for parathyroid adenoma.

## 2016-05-15 ENCOUNTER — Other Ambulatory Visit (HOSPITAL_COMMUNITY): Payer: Self-pay | Admitting: Family Medicine

## 2016-05-15 ENCOUNTER — Ambulatory Visit (HOSPITAL_COMMUNITY)
Admission: RE | Admit: 2016-05-15 | Discharge: 2016-05-15 | Disposition: A | Discharge status: Home / Self Care | Payer: BLUE CROSS/BLUE SHIELD | Source: Ambulatory Visit | Attending: Family Medicine | Admitting: Family Medicine

## 2016-05-15 DIAGNOSIS — M546 Pain in thoracic spine: Secondary | ICD-10-CM | POA: Insufficient documentation

## 2019-10-06 HISTORY — PX: FINGER SURGERY: SHX640

## 2021-01-11 ENCOUNTER — Other Ambulatory Visit (HOSPITAL_COMMUNITY): Payer: Self-pay | Admitting: Family Medicine

## 2021-01-11 DIAGNOSIS — Z1231 Encounter for screening mammogram for malignant neoplasm of breast: Secondary | ICD-10-CM

## 2021-02-19 ENCOUNTER — Other Ambulatory Visit: Payer: Self-pay

## 2021-02-19 ENCOUNTER — Ambulatory Visit (HOSPITAL_COMMUNITY)
Admission: RE | Admit: 2021-02-19 | Discharge: 2021-02-19 | Disposition: A | Discharge status: Home / Self Care | Payer: 59 | Source: Ambulatory Visit | Attending: Family Medicine | Admitting: Family Medicine

## 2021-02-19 DIAGNOSIS — Z1231 Encounter for screening mammogram for malignant neoplasm of breast: Secondary | ICD-10-CM | POA: Diagnosis not present

## 2021-04-12 DIAGNOSIS — R69 Illness, unspecified: Secondary | ICD-10-CM | POA: Diagnosis not present

## 2021-04-25 ENCOUNTER — Telehealth: Payer: 59 | Admitting: Physician Assistant

## 2021-04-25 DIAGNOSIS — J0100 Acute maxillary sinusitis, unspecified: Secondary | ICD-10-CM

## 2021-04-25 MED ORDER — FLUTICASONE PROPIONATE 50 MCG/ACT NA SUSP: 2.0000 | Freq: Every day | NASAL | 0 refills | Status: DC

## 2021-04-25 MED ORDER — AMOXICILLIN-POT CLAVULANATE 875-125 MG PO TABS: 1.0000 | ORAL_TABLET | Freq: Two times a day (BID) | ORAL | 0 refills | Status: DC

## 2021-04-25 MED ORDER — BENZONATATE 100 MG PO CAPS: 100.0000 mg | ORAL_CAPSULE | Freq: Three times a day (TID) | ORAL | 0 refills | Status: DC | PRN

## 2021-04-25 NOTE — Patient Instructions (Signed)
?[name], thank you for joining [name], PA-C for today's virtual visit.  While this provider is not your primary care provider (PCP), if your PCP is located in our provider database this encounter information will be shared with them immediately following your visit. ? ?Consent: ?(Patient) Mahlia Fernando provided verbal consent for this virtual visit at the beginning of the encounter. ? ?Current Medications: ? ?Current Outpatient Medications:  ?  ALPRAZolam (XANAX) 1 MG tablet, Take 1-2 mg by mouth 4 (four) times daily as needed for anxiety or sleep. Anxiety., Disp: , Rfl:  ?  HYDROcodone-acetaminophen (NORCO/VICODIN) 5-325 MG per tablet, Take 1 tablet by mouth every 4 (four) hours as needed., Disp: 20 tablet, Rfl: 0 ?  ibuprofen (ADVIL,MOTRIN) 200 MG tablet, Take 400 mg by mouth every 6 (six) hours as needed for moderate pain., Disp: , Rfl:  ?  losartan (COZAAR) 50 MG tablet, Take 50 mg by mouth daily., Disp: , Rfl:  ?  Vitamin D, Ergocalciferol, (DRISDOL) 50000 UNITS CAPS capsule, Take 50,000 Units by mouth every 7 (seven) days. Take on Sunday., Disp: , Rfl: 0  ? ?Medications ordered in this encounter:  ?No orders of the defined types were placed in this encounter. ?  ? ?*If you need refills on other medications prior to your next appointment, please contact your pharmacy* ? ?Follow-Up: ?Call back or seek an in-person evaluation if the symptoms worsen or if the condition fails to improve as anticipated. ? ?Other Instructions ?Please take antibiotic as directed.  Increase fluid intake.  Use Saline nasal spray.  Take a daily multivitamin. Use the Tessalon and Flonase as directed.  Place a humidifier in the bedroom.  Please call or return clinic if symptoms are not improving. ? ?Sinusitis ?Sinusitis is redness, soreness, and swelling (inflammation) of the paranasal sinuses. Paranasal sinuses are air pockets within the bones of your face (beneath the eyes, the middle of the forehead, or above  the eyes). In healthy paranasal sinuses, mucus is able to drain out, and air is able to circulate through them by way of your nose. However, when your paranasal sinuses are inflamed, mucus and air can become trapped. This can allow bacteria and other germs to grow and cause infection. ?Sinusitis can develop quickly and last only a short time (acute) or continue over a long period (chronic). Sinusitis that lasts for more than 12 weeks is considered chronic.  ?CAUSES  ?Causes of sinusitis include: ?Allergies. ?Structural abnormalities, such as displacement of the cartilage that separates your nostrils (deviated septum), which can decrease the air flow through your nose and sinuses and affect sinus drainage. ?Functional abnormalities, such as when the small hairs (cilia) that line your sinuses and help remove mucus do not work properly or are not present. ?SYMPTOMS  ?Symptoms of acute and chronic sinusitis are the same. The primary symptoms are pain and pressure around the affected sinuses. Other symptoms include: ?Upper toothache. ?Earache. ?Headache. ?Bad breath. ?Decreased sense of smell and taste. ?A cough, which worsens when you are lying flat. ?Fatigue. ?Fever. ?Thick drainage from your nose, which often is green and may contain pus (purulent). ?Swelling and warmth over the affected sinuses. ?DIAGNOSIS  ?Your caregiver will perform a physical exam. During the exam, your caregiver may: ?Look in your nose for signs of abnormal growths in your nostrils (nasal polyps). ?Tap over the affected sinus to check for signs of infection. ?View the inside of your sinuses (endoscopy) with a special imaging device with a light attached (endoscope),  which is inserted into your sinuses. ?If your caregiver suspects that you have chronic sinusitis, one or more of the following tests may be recommended: ?Allergy tests. ?Nasal culture A sample of mucus is taken from your nose and sent to a lab and screened for bacteria. ?Nasal  cytology A sample of mucus is taken from your nose and examined by your caregiver to determine if your sinusitis is related to an allergy. ?TREATMENT  ?Most cases of acute sinusitis are related to a viral infection and will resolve on their own within 10 days. Sometimes medicines are prescribed to help relieve symptoms (pain medicine, decongestants, nasal steroid sprays, or saline sprays).  ?However, for sinusitis related to a bacterial infection, your caregiver will prescribe antibiotic medicines. These are medicines that will help kill the bacteria causing the infection.  ?Rarely, sinusitis is caused by a fungal infection. In theses cases, your caregiver will prescribe antifungal medicine. ?For some cases of chronic sinusitis, surgery is needed. Generally, these are cases in which sinusitis recurs more than 3 times per year, despite other treatments. ?HOME CARE INSTRUCTIONS  ?Drink plenty of water. Water helps thin the mucus so your sinuses can drain more easily. ?Use a humidifier. ?Inhale steam 3 to 4 times a day (for example, sit in the bathroom with the shower running). ?Apply a warm, moist washcloth to your face 3 to 4 times a day, or as directed by your caregiver. ?Use saline nasal sprays to help moisten and clean your sinuses. ?Take over-the-counter or prescription medicines for pain, discomfort, or fever only as directed by your caregiver. ?SEEK IMMEDIATE MEDICAL CARE IF: ?You have increasing pain or severe headaches. ?You have nausea, vomiting, or drowsiness. ?You have swelling around your face. ?You have vision problems. ?You have a stiff neck. ?You have difficulty breathing. ?MAKE SURE YOU:  ?Understand these instructions. ?Will watch your condition. ?Will get help right away if you are not doing well or get worse. ?Document Released: 01/21/2005 Document Revised: 04/15/2011 Document Reviewed: 02/05/2011 ?Exit[employer]? Patient Information ?2014 [place], [place]. ? ? ? ?If you have been instructed to have an  in-person evaluation today at a local Urgent Care facility, please use the link below. It will take you to a list of all of our available [facility], including address, phone number and hours of operation. Please do not delay care.  ?[facility] ? ?If you or a family member do not have a primary care provider, use the link below to schedule a visit and establish care. When you choose a [facility] primary care physician or advanced practice provider, you gain a long-term partner in health. ?Find a Primary Care Provider ? ?Learn more about [facility]'s in-office and virtual care options: ?[facility] - Get Care Now  ?

## 2021-04-25 NOTE — Progress Notes (Signed)
?Virtual Visit Consent  ? ?[name], you are scheduled for a virtual visit with a [facility] provider today.   ?  ?Just as with appointments in the office, your consent must be obtained to participate.  Your consent will be active for this visit and any virtual visit you may have with one of our providers in the next 365 days.   ?  ?If you have a MyChart account, a copy of this consent can be sent to you electronically.  All virtual visits are billed to your insurance company just like a traditional visit in the office.   ? ?As this is a virtual visit, video technology does not allow for your provider to perform a traditional examination.  This may limit your provider's ability to fully assess your condition.  If your provider identifies any concerns that need to be evaluated in person or the need to arrange testing (such as labs, EKG, etc.), we will make arrangements to do so.   ?  ?Although advances in technology are sophisticated, we cannot ensure that it will always work on either your end or our end.  If the connection with a video visit is poor, the visit may have to be switched to a telephone visit.  With either a video or telephone visit, we are not always able to ensure that we have a secure connection.    ? ?I need to obtain your verbal consent now.   Are you willing to proceed with your visit today?  ?  ?[name] has provided verbal consent on 04/25/2021 for a virtual visit (video or telephone). ?  ?[name], PA-C  ? ?Date: 04/25/2021 6:20 PM ? ? ?Virtual Visit via Video Note  ? ?IPiedad Climes, connected with  Pernell Dikes  ([id number], 1970/09/17) on 04/25/21 at  6:15 PM EDT by a video-enabled telemedicine application and verified that I am speaking with the correct person using two identifiers. ? ?Location: ?Patient: Virtual Visit Location Patient: Home ?Provider: Virtual Visit Location Provider: Home Office ?  ?I discussed the limitations of evaluation and management  by telemedicine and the availability of in person appointments. The patient expressed understanding and agreed to proceed.   ? ?History of Present Illness: ?Margaret [name] is a 51 y.o. who identifies as a female who was assigned female at birth, and is being seen today for for URI symptoms starting last week with nasal and head congestion, sore throat, cough that is now productive of yellow phlegm. Has had a fever with Tmax 101. Denies chest pain. Denies recent travel or sick contact. Has some grandchildren that have been sick. Is now having maxillary sinus pain.  Has been taking Advil Cold and Sinus.  ? ?HPI: HPI  ?Problems: There are no problems to display for this patient. ?  ?Allergies: No Known Allergies ?Medications:  ?Current Outpatient Medications:  ?  amoxicillin-clavulanate (AUGMENTIN) 875-125 MG tablet, Take 1 tablet by mouth 2 (two) times daily., Disp: 14 tablet, Rfl: 0 ?  benzonatate (TESSALON) 100 MG capsule, Take 1 capsule (100 mg total) by mouth 3 (three) times daily as needed for cough., Disp: 30 capsule, Rfl: 0 ?  fluticasone (FLONASE) 50 MCG/ACT nasal spray, Place 2 sprays into both nostrils daily., Disp: 16 g, Rfl: 0 ?  ALPRAZolam (XANAX) 1 MG tablet, Take 1-2 mg by mouth 4 (four) times daily as needed for anxiety or sleep. Anxiety., Disp: , Rfl:  ?  ibuprofen (ADVIL,MOTRIN) 200 MG tablet, Take 400 mg by  mouth every 6 (six) hours as needed for moderate pain., Disp: , Rfl:  ?  losartan (COZAAR) 50 MG tablet, Take 50 mg by mouth daily., Disp: , Rfl:  ?  Vitamin D, Ergocalciferol, (DRISDOL) 50000 UNITS CAPS capsule, Take 50,000 Units by mouth every 7 (seven) days. Take on Sunday., Disp: , Rfl: 0 ? ?Observations/Objective: ?Patient is well-developed, well-nourished in no acute distress.  ?Resting comfortably at home.  ?Head is normocephalic, atraumatic.  ?No labored breathing. ?Speech is clear and coherent with logical content.  ?Patient is alert and oriented at baseline.  ? ?Assessment and  Plan: ?1. Acute non-recurrent maxillary sinusitis ?- fluticasone (FLONASE) 50 MCG/ACT nasal spray; Place 2 sprays into both nostrils daily.  Dispense: 16 g; Refill: 0 ?- amoxicillin-clavulanate (AUGMENTIN) 875-125 MG tablet; Take 1 tablet by mouth 2 (two) times daily.  Dispense: 14 tablet; Refill: 0 ?- benzonatate (TESSALON) 100 MG capsule; Take 1 capsule (100 mg total) by mouth 3 (three) times daily as needed for cough.  Dispense: 30 capsule; Refill: 0 ? ?Rx Augmentin.  Increase fluids.  Rest.  Saline nasal spray.  Probiotic.  Mucinex as directed.  Humidifier in bedroom. Flonase and Tessalon per orders.  Call or return to clinic if symptoms are not improving. ? ? ?Follow Up Instructions: ?I discussed the assessment and treatment plan with the patient. The patient was provided an opportunity to ask questions and all were answered. The patient agreed with the plan and demonstrated an understanding of the instructions.  A copy of instructions were sent to the patient via MyChart unless otherwise noted below.  ? ?The patient was advised to call back or seek an in-person evaluation if the symptoms worsen or if the condition fails to improve as anticipated. ? ?Time:  ?I spent 15 minutes with the patient via telehealth technology discussing the above problems/concerns.   ? ?[name], PA-C ?

## 2021-07-25 DIAGNOSIS — I10 Essential (primary) hypertension: Secondary | ICD-10-CM | POA: Diagnosis not present

## 2021-07-25 DIAGNOSIS — E785 Hyperlipidemia, unspecified: Secondary | ICD-10-CM | POA: Diagnosis not present

## 2021-07-25 DIAGNOSIS — E663 Overweight: Secondary | ICD-10-CM | POA: Diagnosis not present

## 2021-08-27 ENCOUNTER — Telehealth: Payer: 59 | Admitting: Nurse Practitioner

## 2021-08-27 DIAGNOSIS — J0110 Acute frontal sinusitis, unspecified: Secondary | ICD-10-CM

## 2021-08-27 DIAGNOSIS — R051 Acute cough: Secondary | ICD-10-CM | POA: Diagnosis not present

## 2021-08-27 MED ORDER — BENZONATATE 100 MG PO CAPS: 100.0000 mg | ORAL_CAPSULE | Freq: Three times a day (TID) | ORAL | 0 refills | Status: DC | PRN

## 2021-08-27 MED ORDER — AMOXICILLIN-POT CLAVULANATE 875-125 MG PO TABS: 1.0000 | ORAL_TABLET | Freq: Two times a day (BID) | ORAL | 0 refills | Status: AC

## 2021-08-27 NOTE — Progress Notes (Signed)
Virtual Visit Consent   Margaret [name], you are scheduled for a virtual visit with a [facility] provider today. Just as with appointments in the office, your consent must be obtained to participate. Your consent will be active for this visit and any virtual visit you may have with one of our providers in the next 365 days. If you have a MyChart account, a copy of this consent can be sent to you electronically.  As this is a virtual visit, video technology does not allow for your provider to perform a traditional examination. This may limit your provider's ability to fully assess your condition. If your provider identifies any concerns that need to be evaluated in person or the need to arrange testing (such as labs, EKG, etc.), we will make arrangements to do so. Although advances in technology are sophisticated, we cannot ensure that it will always work on either your end or our end. If the connection with a video visit is poor, the visit may have to be switched to a telephone visit. With either a video or telephone visit, we are not always able to ensure that we have a secure connection.  By engaging in this virtual visit, you consent to the provision of healthcare and authorize for your insurance to be billed (if applicable) for the services provided during this visit. Depending on your insurance coverage, you may receive a charge related to this service.  I need to obtain your verbal consent now. Are you willing to proceed with your visit today? Margaret [name] has provided verbal consent on 08/27/2021 for a virtual visit (video or telephone). Viviano Simas, FNP  Date: 08/27/2021 11:31 AM  Virtual Visit via Video Note   I, Viviano Simas, connected with  Margaret [name]  ([id number], 10/03/1970) on 08/27/21 at 11:30 AM EDT by a video-enabled telemedicine application and verified that I am speaking with the correct person using two identifiers.  Location: Patient: Virtual Visit Location Patient:  Home Provider: Virtual Visit Location Provider: Home Office   I discussed the limitations of evaluation and management by telemedicine and the availability of in person appointments. The patient expressed understanding and agreed to proceed.    History of Present Illness: Margaret [name] is a 51 y.o. who identifies as a female who was assigned female at birth, and is being seen today with complaints of sinus congestion and this morning she woke up with her eye swollen with drainage and feeling like her nose is swollen as well.  She has post nasal drainage as well with a dry cough.   She has had a sinus infection in the past and feels this is similar, she at most gets two per year.   She does suffer from allergies and takes claritin daily.   She has been using Mucinex without relief.   She denies a fever    Problems: There are no problems to display for this patient.   Allergies: No Known Allergies Medications:  Current Outpatient Medications:    ALPRAZolam (XANAX) 1 MG tablet, Take 1-2 mg by mouth 4 (four) times daily as needed for anxiety or sleep. Anxiety., Disp: , Rfl:    amoxicillin-clavulanate (AUGMENTIN) 875-125 MG tablet, Take 1 tablet by mouth 2 (two) times daily., Disp: 14 tablet, Rfl: 0   benzonatate (TESSALON) 100 MG capsule, Take 1 capsule (100 mg total) by mouth 3 (three) times daily as needed for cough., Disp: 30 capsule, Rfl: 0   fluticasone (FLONASE) 50 MCG/ACT nasal spray, Place 2 sprays into  both nostrils daily., Disp: 16 g, Rfl: 0   ibuprofen (ADVIL,MOTRIN) 200 MG tablet, Take 400 mg by mouth every 6 (six) hours as needed for moderate pain., Disp: , Rfl:    losartan (COZAAR) 50 MG tablet, Take 50 mg by mouth daily., Disp: , Rfl:    Vitamin D, Ergocalciferol, (DRISDOL) 50000 UNITS CAPS capsule, Take 50,000 Units by mouth every 7 (seven) days. Take on Sunday., Disp: , Rfl: 0  Observations/Objective: Patient is well-developed, well-nourished in no acute distress.   Resting comfortably  at home.  Head is normocephalic, atraumatic.  No labored breathing.  Speech is clear and coherent with logical content.  Patient is alert and oriented at baseline.    Assessment and Plan: 1. Acute non-recurrent frontal sinusitis  - amoxicillin-clavulanate (AUGMENTIN) 875-125 MG tablet; Take 1 tablet by mouth 2 (two) times daily for 7 days. Take with food  Dispense: 14 tablet; Refill: 0  2. Acute cough  - benzonatate (TESSALON) 100 MG capsule; Take 1 capsule (100 mg total) by mouth 3 (three) times daily as needed.  Dispense: 30 capsule; Refill: 0     Follow Up Instructions: I discussed the assessment and treatment plan with the patient. The patient was provided an opportunity to ask questions and all were answered. The patient agreed with the plan and demonstrated an understanding of the instructions.  A copy of instructions were sent to the patient via MyChart unless otherwise noted below.    The patient was advised to call back or seek an in-person evaluation if the symptoms worsen or if the condition fails to improve as anticipated.  Time:  I spent 10 minutes with the patient via telehealth technology discussing the above problems/concerns.    Viviano Simas, FNP

## 2021-10-03 ENCOUNTER — Ambulatory Visit
Admission: RE | Admit: 2021-10-03 | Discharge: 2021-10-03 | Disposition: A | Discharge status: Home / Self Care | Payer: 59 | Source: Ambulatory Visit | Attending: Family Medicine | Admitting: Family Medicine

## 2021-10-03 VITALS — BP 114/80 | HR 88 | Temp 98.5°F | Resp 17

## 2021-10-03 DIAGNOSIS — H00011 Hordeolum externum right upper eyelid: Secondary | ICD-10-CM

## 2021-10-03 MED ORDER — ERYTHROMYCIN 5 MG/GM OP OINT: TOPICAL_OINTMENT | OPHTHALMIC | 0 refills | Status: DC

## 2021-10-03 NOTE — ED Provider Notes (Signed)
[facility]    CSN: [id number] Arrival date & time: 10/03/21  1211      History   Chief Complaint Chief Complaint  Patient presents with   Eye Problem    Swollen eye lid and pain. No redness - Entered by patient   Belepharitis   HPI Margaret [name] is a 51 y.o. female.   Pt presents with complaints of right eyelid pain and swelling x 3 days. No eye involvement denies vision trouble. Pt is also tender underneath her eye.      Past Medical History:  Diagnosis Date   Anxiety    Hypercalcemia    Hypercalcemia    Hypertension    Vitamin D deficiency    There are no problems to display for this patient.  Past Surgical History:  Procedure Laterality Date   ABDOMINAL HYSTERECTOMY     CHOLECYSTECTOMY     OB History   No obstetric history on file.     Home Medications    Prior to Admission medications   Medication Sig Start Date End Date Taking? Authorizing Provider  erythromycin ophthalmic ointment Place a 1/2 inch ribbon of ointment into the right lower eyelid BID prn. 10/03/21  Yes Particia Nearing, PA-C  ALPRAZolam Prudy Feeler) 1 MG tablet Take 1-2 mg by mouth 4 (four) times daily as needed for anxiety or sleep. Anxiety.    [provider]  amoxicillin-clavulanate (AUGMENTIN) 875-125 MG tablet Take 1 tablet by mouth 2 (two) times daily. 04/25/21   Waldon Merl, PA-C  benzonatate (TESSALON) 100 MG capsule Take 1 capsule (100 mg total) by mouth 3 (three) times daily as needed for cough. 04/25/21   Waldon Merl, PA-C  benzonatate (TESSALON) 100 MG capsule Take 1 capsule (100 mg total) by mouth 3 (three) times daily as needed. 08/27/21   Viviano Simas, FNP  fluticasone (FLONASE) 50 MCG/ACT nasal spray Place 2 sprays into both nostrils daily. 04/25/21   Waldon Merl, PA-C  ibuprofen (ADVIL,MOTRIN) 200 MG tablet Take 400 mg by mouth every 6 (six) hours as needed for moderate pain.    [provider]  losartan (COZAAR) 50 MG tablet  Take 50 mg by mouth daily.    [provider]  Vitamin D, Ergocalciferol, (DRISDOL) 50000 UNITS CAPS capsule Take 50,000 Units by mouth every 7 (seven) days. Take on Sunday. 02/23/14   [provider]   Family History Family History  Problem Relation Age of Onset   COPD Mother    Social History Social History   Tobacco Use   Smoking status: Every Day    Packs/day: 0.50    Types: Cigarettes  Substance Use Topics   Alcohol use: No   Drug use: No   Allergies   Patient has no known allergies.  Review of Systems Review of Systems PER HPI  Physical Exam Triage Vital Signs ED Triage Vitals  Enc Vitals Group     BP 10/03/21 1220 114/80     Pulse Rate 10/03/21 1220 88     Resp 10/03/21 1220 17     Temp 10/03/21 1220 98.5 F (36.9 C)     Temp src --      SpO2 10/03/21 1220 97 %     Weight --      Height --      Head Circumference --      Peak Flow --      Pain Score 10/03/21 1219 7     Pain Loc --  Pain Edu? --      Excl. in GC? --    No data found.  Updated Vital Signs BP 114/80   Pulse 88   Temp 98.5 F (36.9 C)   Resp 17   SpO2 97%   Visual Acuity Right Eye Distance:   Left Eye Distance:   Bilateral Distance:    Right Eye Near:   Left Eye Near:    Bilateral Near:     Physical Exam Vitals and nursing note reviewed.  Constitutional:      Appearance: Normal appearance. She is not ill-appearing.  HENT:     Head: Atraumatic.     Mouth/Throat:     Mouth: Mucous membranes are moist.  Eyes:     Extraocular Movements: Extraocular movements intact.     Conjunctiva/sclera: Conjunctivae normal.     Pupils: Pupils are equal, round, and reactive to light.     Comments: Right upper eyelid localized area of erythema, edema, tenderness to palpation.  Small stye present to the lash line in this area.  Cardiovascular:     Rate and Rhythm: Normal rate and regular rhythm.     Heart sounds: Normal heart sounds.  Pulmonary:     Effort:  Pulmonary effort is normal.     Breath sounds: Normal breath sounds.  Musculoskeletal:        General: Normal range of motion.     Cervical back: Normal range of motion and neck supple.  Skin:    General: Skin is warm and dry.  Neurological:     Mental Status: She is alert and oriented to person, place, and time.     Motor: No weakness.     Gait: Gait normal.  Psychiatric:        Mood and Affect: Mood normal.        Thought Content: Thought content normal.        Judgment: Judgment normal.    UC Treatments / Results  Labs (all labs ordered are listed, but only abnormal results are displayed) Labs Reviewed - No data to display  EKG   Radiology No results found.  Procedures Procedures (including critical care time)  Medications Ordered in UC Medications - No data to display  Initial Impression / Assessment and Plan / UC Course  I have reviewed the triage vital signs and the nursing notes.  Pertinent labs & imaging results that were available during my care of the patient were reviewed by me and considered in my medical decision making (see chart for details).     Treat with erythromycin ointment, warm compresses, good handwashing.  Return for worsening symptoms.  Visual acuity deferred today as patient states vision intact and eye itself unaffected.  Final Clinical Impressions(s) / UC Diagnoses   Final diagnoses:  Hordeolum externum of right upper eyelid   Discharge Instructions   None    ED Prescriptions     Medication Sig Dispense Auth. Provider   erythromycin ophthalmic ointment Place a 1/2 inch ribbon of ointment into the right lower eyelid BID prn. 3.5 g Particia Nearing, PA-C      PDMP not reviewed this encounter.   Particia Nearing, [place] 10/03/21 1353

## 2021-10-03 NOTE — ED Triage Notes (Signed)
Pt presents with complaints of right eyelid pain and swelling x 3 days. No eye involvement denies vision trouble. Pt is also tender underneath her eye.

## 2021-11-16 DIAGNOSIS — R69 Illness, unspecified: Secondary | ICD-10-CM | POA: Diagnosis not present

## 2021-12-21 ENCOUNTER — Telehealth: Payer: 59 | Admitting: Family Medicine

## 2021-12-21 DIAGNOSIS — B9689 Other specified bacterial agents as the cause of diseases classified elsewhere: Secondary | ICD-10-CM

## 2021-12-21 DIAGNOSIS — J019 Acute sinusitis, unspecified: Secondary | ICD-10-CM | POA: Diagnosis not present

## 2021-12-21 MED ORDER — FLUTICASONE PROPIONATE 50 MCG/ACT NA SUSP: 2.0000 | Freq: Every day | NASAL | 0 refills | Status: DC

## 2021-12-21 MED ORDER — DOXYCYCLINE HYCLATE 100 MG PO TABS: 100.0000 mg | ORAL_TABLET | Freq: Two times a day (BID) | ORAL | 0 refills | Status: AC

## 2021-12-21 NOTE — Progress Notes (Signed)
Virtual Visit Consent   Margaret [name], you are scheduled for a virtual visit with a [facility] provider today. Just as with appointments in the office, your consent must be obtained to participate. Your consent will be active for this visit and any virtual visit you may have with one of our providers in the next 365 days. If you have a MyChart account, a copy of this consent can be sent to you electronically.  As this is a virtual visit, video technology does not allow for your provider to perform a traditional examination. This may limit your provider's ability to fully assess your condition. If your provider identifies any concerns that need to be evaluated in person or the need to arrange testing (such as labs, EKG, etc.), we will make arrangements to do so. Although advances in technology are sophisticated, we cannot ensure that it will always work on either your end or our end. If the connection with a video visit is poor, the visit may have to be switched to a telephone visit. With either a video or telephone visit, we are not always able to ensure that we have a secure connection.  By engaging in this virtual visit, you consent to the provision of healthcare and authorize for your insurance to be billed (if applicable) for the services provided during this visit. Depending on your insurance coverage, you may receive a charge related to this service.  I need to obtain your verbal consent now. Are you willing to proceed with your visit today? Margaret [name] has provided verbal consent on 12/21/2021 for a virtual visit (video or telephone). [name], NP  Date: 12/21/2021 11:16 AM  Virtual Visit via Video Note   I, [name], connected with  Margaret [name]  ([id number], [birth year]) on 12/21/21 at 11:15 AM EST by a video-enabled telemedicine application and verified that I am speaking with the correct person using two identifiers.  Location: Patient: Virtual Visit Location  Patient: Home Provider: Virtual Visit Location Provider: Home Office   I discussed the limitations of evaluation and management by telemedicine and the availability of in person appointments. The patient expressed understanding and agreed to proceed.    History of Present Illness: Margaret [name] is a 51 y.o. who identifies as a female who was assigned female at birth, and is being seen today for sinus symptoms. Covid negative. Onset was a 7 days ago. First symptoms included stuffy nose and post nasal drainage that caused a sore throat. This progressed to nasal congestion, ear popping and cracking, eyes and nose pressure and swelling, sinus tenderness. Denies no current fevers, chills, chest pain, shortness of breath, cough.   Problems: There are no problems to display for this patient.   Allergies: No Known Allergies Medications:  Current Outpatient Medications:    ALPRAZolam (XANAX) 1 MG tablet, Take 1-2 mg by mouth 4 (four) times daily as needed for anxiety or sleep. Anxiety., Disp: , Rfl:    amoxicillin-clavulanate (AUGMENTIN) 875-125 MG tablet, Take 1 tablet by mouth 2 (two) times daily., Disp: 14 tablet, Rfl: 0   benzonatate (TESSALON) 100 MG capsule, Take 1 capsule (100 mg total) by mouth 3 (three) times daily as needed for cough., Disp: 30 capsule, Rfl: 0   benzonatate (TESSALON) 100 MG capsule, Take 1 capsule (100 mg total) by mouth 3 (three) times daily as needed., Disp: 30 capsule, Rfl: 0   erythromycin ophthalmic ointment, Place a 1/2 inch ribbon of ointment into the right lower eyelid BID prn.,  Disp: 3.5 g, Rfl: 0   fluticasone (FLONASE) 50 MCG/ACT nasal spray, Place 2 sprays into both nostrils daily., Disp: 16 g, Rfl: 0   ibuprofen (ADVIL,MOTRIN) 200 MG tablet, Take 400 mg by mouth every 6 (six) hours as needed for moderate pain., Disp: , Rfl:    losartan (COZAAR) 50 MG tablet, Take 50 mg by mouth daily., Disp: , Rfl:    Vitamin D, Ergocalciferol, (DRISDOL) 50000 UNITS CAPS  capsule, Take 50,000 Units by mouth every 7 (seven) days. Take on Sunday., Disp: , Rfl: 0  Observations/Objective: Patient is well-developed, well-nourished in no acute distress.  Resting comfortably  at home.  Head is normocephalic, atraumatic.  No labored breathing.  Speech is clear and coherent with logical content.  Patient is alert and oriented at baseline.  Nasal tone noted  Assessment and Plan: 1. Acute bacterial sinusitis  - doxycycline (VIBRA-TABS) 100 MG tablet; Take 1 tablet (100 mg total) by mouth 2 (two) times daily for 10 days.  Dispense: 20 tablet; Refill: 0 - fluticasone (FLONASE) 50 MCG/ACT nasal spray; Place 2 sprays into both nostrils daily.  Dispense: 16 g; Refill: 0  -Take meds as prescribed -Rest -Use a cool mist humidifier especially during the winter months when heat dries out the air. - Use saline nose sprays frequently to help soothe nasal passages and promote drainage. -Saline irrigations of the nose can be very helpful if done frequently.             * 4X daily for 1 week*             * Use of a nettie pot can be helpful with this.  *Follow directions with this* *Boiled or distilled water only -stay hydrated by drinking plenty of fluids - Keep thermostat turn down low to prevent drying out sinuses - For any cough or congestion- robitussin DM or Delsym as needed - For fever or aches or pains- take tylenol or ibuprofen as directed on bottle             * for fevers greater than 101 orally you may alternate ibuprofen and tylenol every 3 hours.  If you do not improve you will need a follow up visit in person.                Reviewed side effects, risks and benefits of medication.    Patient acknowledged agreement and understanding of the plan.   Past Medical, Surgical, Social History, Allergies, and Medications have been Reviewed.     Follow Up Instructions: I discussed the assessment and treatment plan with the patient. The patient was provided an  opportunity to ask questions and all were answered. The patient agreed with the plan and demonstrated an understanding of the instructions.  A copy of instructions were sent to the patient via MyChart unless otherwise noted below.     The patient was advised to call back or seek an in-person evaluation if the symptoms worsen or if the condition fails to improve as anticipated.  Time:  I spent 7 minutes with the patient via telehealth technology discussing the above problems/concerns.    [name], NP

## 2021-12-21 NOTE — Patient Instructions (Signed)
[name], thank you for joining [name], NP for today's virtual visit.  While this provider is not your primary care provider (PCP), if your PCP is located in our provider database this encounter information will be shared with them immediately following your visit.   A [facility] MyChart account gives you access to today's visit and all your visits, tests, and labs performed at [facility] " click here if you don't have a [facility] MyChart account or go to [email or web address]  Consent: (Patient) Margaret [name] provided verbal consent for this virtual visit at the beginning of the encounter.  Current Medications:  Current Outpatient Medications:    doxycycline (VIBRA-TABS) 100 MG tablet, Take 1 tablet (100 mg total) by mouth 2 (two) times daily for 10 days., Disp: 20 tablet, Rfl: 0   fluticasone (FLONASE) 50 MCG/ACT nasal spray, Place 2 sprays into both nostrils daily., Disp: 16 g, Rfl: 0   ALPRAZolam (XANAX) 1 MG tablet, Take 1-2 mg by mouth 4 (four) times daily as needed for anxiety or sleep. Anxiety., Disp: , Rfl:    benzonatate (TESSALON) 100 MG capsule, Take 1 capsule (100 mg total) by mouth 3 (three) times daily as needed for cough., Disp: 30 capsule, Rfl: 0   benzonatate (TESSALON) 100 MG capsule, Take 1 capsule (100 mg total) by mouth 3 (three) times daily as needed., Disp: 30 capsule, Rfl: 0   erythromycin ophthalmic ointment, Place a 1/2 inch ribbon of ointment into the right lower eyelid BID prn., Disp: 3.5 g, Rfl: 0   ibuprofen (ADVIL,MOTRIN) 200 MG tablet, Take 400 mg by mouth every 6 (six) hours as needed for moderate pain., Disp: , Rfl:    losartan (COZAAR) 50 MG tablet, Take 50 mg by mouth daily., Disp: , Rfl:    Vitamin D, Ergocalciferol, (DRISDOL) 50000 UNITS CAPS capsule, Take 50,000 Units by mouth every 7 (seven) days. Take on Sunday., Disp: , Rfl: 0   Medications ordered in this encounter:  Meds ordered this encounter  Medications    doxycycline (VIBRA-TABS) 100 MG tablet    Sig: Take 1 tablet (100 mg total) by mouth 2 (two) times daily for 10 days.    Dispense:  20 tablet    Refill:  0    Order Specific Question:   Supervising Provider    Answer:   Merrilee Jansky [[id number]]   fluticasone (FLONASE) 50 MCG/ACT nasal spray    Sig: Place 2 sprays into both nostrils daily.    Dispense:  16 g    Refill:  0    Order Specific Question:   Supervising Provider    Answer:   Merrilee Jansky [id number]     *If you need refills on other medications prior to your next appointment, please contact your pharmacy*  Follow-Up: Call back or seek an in-person evaluation if the symptoms worsen or if the condition fails to improve as anticipated.  [facility] [phone]  Other Instructions  -Take meds as prescribed -Rest -Use a cool mist humidifier especially during the winter months when heat dries out the air. - Use saline nose sprays frequently to help soothe nasal passages and promote drainage. -Saline irrigations of the nose can be very helpful if done frequently.             * 4X daily for 1 week*             * Use of a nettie pot can be helpful with this.  *  Follow directions with this* *Boiled or distilled water only -stay hydrated by drinking plenty of fluids - Keep thermostat turn down low to prevent drying out sinuses - For any cough or congestion- robitussin DM or Delsym as needed - For fever or aches or pains- take tylenol or ibuprofen as directed on bottle             * for fevers greater than 101 orally you may alternate ibuprofen and tylenol every 3 hours.  If you do not improve you will need a follow up visit in person.                  If you have been instructed to have an in-person evaluation today at a local Urgent Care facility, please use the link below. It will take you to a list of all of our available [facility], including address, phone number and hours of  operation. Please do not delay care.  [facility]  If you or a family member do not have a primary care provider, use the link below to schedule a visit and establish care. When you choose a [facility] primary care physician or advanced practice provider, you gain a long-term partner in health. Find a Primary Care Provider  Learn more about [facility]'s in-office and virtual care options: [facility] - Get Care Now

## 2022-02-06 ENCOUNTER — Ambulatory Visit: Payer: 59

## 2022-02-06 DIAGNOSIS — J069 Acute upper respiratory infection, unspecified: Secondary | ICD-10-CM | POA: Diagnosis not present

## 2022-02-06 DIAGNOSIS — Z6826 Body mass index (BMI) 26.0-26.9, adult: Secondary | ICD-10-CM | POA: Diagnosis not present

## 2022-02-06 DIAGNOSIS — R69 Illness, unspecified: Secondary | ICD-10-CM | POA: Diagnosis not present

## 2022-02-06 DIAGNOSIS — E663 Overweight: Secondary | ICD-10-CM | POA: Diagnosis not present

## 2022-05-17 DIAGNOSIS — E785 Hyperlipidemia, unspecified: Secondary | ICD-10-CM | POA: Diagnosis not present

## 2022-05-17 DIAGNOSIS — I10 Essential (primary) hypertension: Secondary | ICD-10-CM | POA: Diagnosis not present

## 2022-05-17 DIAGNOSIS — J069 Acute upper respiratory infection, unspecified: Secondary | ICD-10-CM | POA: Diagnosis not present

## 2022-05-17 DIAGNOSIS — F329 Major depressive disorder, single episode, unspecified: Secondary | ICD-10-CM | POA: Diagnosis not present

## 2022-05-21 ENCOUNTER — Other Ambulatory Visit (HOSPITAL_COMMUNITY): Payer: Self-pay | Admitting: Family Medicine

## 2022-05-21 DIAGNOSIS — Z1231 Encounter for screening mammogram for malignant neoplasm of breast: Secondary | ICD-10-CM

## 2022-05-28 ENCOUNTER — Telehealth: Payer: 59 | Admitting: Family Medicine

## 2022-05-28 DIAGNOSIS — J019 Acute sinusitis, unspecified: Secondary | ICD-10-CM | POA: Diagnosis not present

## 2022-05-28 DIAGNOSIS — B9689 Other specified bacterial agents as the cause of diseases classified elsewhere: Secondary | ICD-10-CM

## 2022-05-28 MED ORDER — FLUTICASONE PROPIONATE 50 MCG/ACT NA SUSP: 2.0000 | Freq: Every day | NASAL | 0 refills | Status: DC

## 2022-05-28 MED ORDER — AMOXICILLIN-POT CLAVULANATE 875-125 MG PO TABS: 1.0000 | ORAL_TABLET | Freq: Two times a day (BID) | ORAL | 0 refills | Status: AC

## 2022-05-28 NOTE — Progress Notes (Signed)
Virtual Visit Consent   Margaret [name], you are scheduled for a virtual visit with a [facility] provider today. Just as with appointments in the office, your consent must be obtained to participate. Your consent will be active for this visit and any virtual visit you may have with one of our providers in the next 365 days. If you have a MyChart account, a copy of this consent can be sent to you electronically.  As this is a virtual visit, video technology does not allow for your provider to perform a traditional examination. This may limit your provider's ability to fully assess your condition. If your provider identifies any concerns that need to be evaluated in person or the need to arrange testing (such as labs, EKG, etc.), we will make arrangements to do so. Although advances in technology are sophisticated, we cannot ensure that it will always work on either your end or our end. If the connection with a video visit is poor, the visit may have to be switched to a telephone visit. With either a video or telephone visit, we are not always able to ensure that we have a secure connection.  By engaging in this virtual visit, you consent to the provision of healthcare and authorize for your insurance to be billed (if applicable) for the services provided during this visit. Depending on your insurance coverage, you may receive a charge related to this service.  I need to obtain your verbal consent now. Are you willing to proceed with your visit today? [name] has provided verbal consent on 05/28/2022 for a virtual visit (video or telephone). [name], NP  Date: 05/28/2022 10:13 AM  Virtual Visit via Video Note   I, [name], connected with  Margaret [name]  ([id number], 1970/07/01) on 05/28/22 at 10:30 AM EDT by a video-enabled telemedicine application and verified that I am speaking with the correct person using two identifiers.  Location: Patient: Virtual Visit Location Patient:  Home Provider: Virtual Visit Location Provider: Home Office   I discussed the limitations of evaluation and management by telemedicine and the availability of in person appointments. The patient expressed understanding and agreed to proceed.    History of Present Illness: Margaret [name] is a 52 y.o. who identifies as a female who was assigned female at birth, and is being seen today for sinus congestion  Onset was congestion - about 2 weeks ago- thought it was allergies started with Claritin without relief  Associated symptoms are ear popping, fever 101, cough, sore throat Modifying factors are mucinex and claritin without help Denies chest pain, shortness of breath  Exposure to sick contacts- known - mother COVID test: neg   Problems: There are no problems to display for this patient.   Allergies: No Known Allergies Medications:  Current Outpatient Medications:    ALPRAZolam (XANAX) 1 MG tablet, Take 1-2 mg by mouth 4 (four) times daily as needed for anxiety or sleep. Anxiety., Disp: , Rfl:    benzonatate (TESSALON) 100 MG capsule, Take 1 capsule (100 mg total) by mouth 3 (three) times daily as needed for cough., Disp: 30 capsule, Rfl: 0   benzonatate (TESSALON) 100 MG capsule, Take 1 capsule (100 mg total) by mouth 3 (three) times daily as needed., Disp: 30 capsule, Rfl: 0   erythromycin ophthalmic ointment, Place a 1/2 inch ribbon of ointment into the right lower eyelid BID prn., Disp: 3.5 g, Rfl: 0   fluticasone (FLONASE) 50 MCG/ACT nasal spray, Place 2 sprays into  both nostrils daily., Disp: 16 g, Rfl: 0   ibuprofen (ADVIL,MOTRIN) 200 MG tablet, Take 400 mg by mouth every 6 (six) hours as needed for moderate pain., Disp: , Rfl:    losartan (COZAAR) 50 MG tablet, Take 50 mg by mouth daily., Disp: , Rfl:    Vitamin D, Ergocalciferol, (DRISDOL) 50000 UNITS CAPS capsule, Take 50,000 Units by mouth every 7 (seven) days. Take on Sunday., Disp: , Rfl: 0  Observations/Objective: Patient  is well-developed, well-nourished in no acute distress.  Resting comfortably  at home.  Head is normocephalic, atraumatic.  No labored breathing.  Speech is clear and coherent with logical content.  Patient is alert and oriented at baseline.  Cough noted during visit  Assessment and Plan:  1. Acute bacterial sinusitis  - fluticasone (FLONASE) 50 MCG/ACT nasal spray; Place 2 sprays into both nostrils daily.  Dispense: 16 g; Refill: 0 - amoxicillin-clavulanate (AUGMENTIN) 875-125 MG tablet; Take 1 tablet by mouth 2 (two) times daily for 7 days.  Dispense: 14 tablet; Refill: 0  -Take meds as prescribed -Rest -Use a cool mist humidifier especially during the winter months when heat dries out the air. - Use saline nose sprays frequently to help soothe nasal passages and promote drainage. -Saline irrigations of the nose can be very helpful if done frequently.             * 4X daily for 1 week*             * Use of a nettie pot can be helpful with this.  *Follow directions with this* *Boiled or distilled water only -stay hydrated by drinking plenty of fluids - Keep thermostat turn down low to prevent drying out sinuses - For any cough or congestion- robitussin DM or Delsym as needed - For fever or aches or pains- take tylenol or ibuprofen as directed on bottle             * for fevers greater than 101 orally you may alternate ibuprofen and tylenol every 3 hours.  If you do not improve you will need a follow up visit in person.               Reviewed side effects, risks and benefits of medication.    Patient acknowledged agreement and understanding of the plan.   Past Medical, Surgical, Social History, Allergies, and Medications have been Reviewed.    Follow Up Instructions: I discussed the assessment and treatment plan with the patient. The patient was provided an opportunity to ask questions and all were answered. The patient agreed with the plan and demonstrated an understanding  of the instructions.  A copy of instructions were sent to the patient via MyChart unless otherwise noted below.    The patient was advised to call back or seek an in-person evaluation if the symptoms worsen or if the condition fails to improve as anticipated.  Time:  I spent 10 minutes with the patient via telehealth technology discussing the above problems/concerns.    [name], NP

## 2022-05-28 NOTE — Patient Instructions (Signed)
Margaret [name] Seen, thank you for joining [name], NP for today's virtual visit.  While this provider is not your primary care provider (PCP), if your PCP is located in our provider database this encounter information will be shared with them immediately following your visit.   A [facility] MyChart account gives you access to today's visit and all your visits, tests, and labs performed at [facility] " click here if you don't have a [facility] MyChart account or go to [email or web address]  Consent: (Patient) Kamirah Shugrue provided verbal consent for this virtual visit at the beginning of the encounter.  Current Medications:  Current Outpatient Medications:    ALPRAZolam (XANAX) 1 MG tablet, Take 1-2 mg by mouth 4 (four) times daily as needed for anxiety or sleep. Anxiety., Disp: , Rfl:    amoxicillin-clavulanate (AUGMENTIN) 875-125 MG tablet, Take 1 tablet by mouth 2 (two) times daily for 7 days., Disp: 14 tablet, Rfl: 0   fluticasone (FLONASE) 50 MCG/ACT nasal spray, Place 2 sprays into both nostrils daily., Disp: 16 g, Rfl: 0   losartan (COZAAR) 50 MG tablet, Take 50 mg by mouth daily., Disp: , Rfl:    Medications ordered in this encounter:  Meds ordered this encounter  Medications   fluticasone (FLONASE) 50 MCG/ACT nasal spray    Sig: Place 2 sprays into both nostrils daily.    Dispense:  16 g    Refill:  0    Order Specific Question:   Supervising Provider    Answer:   Merrilee Jansky [id number]   amoxicillin-clavulanate (AUGMENTIN) 875-125 MG tablet    Sig: Take 1 tablet by mouth 2 (two) times daily for 7 days.    Dispense:  14 tablet    Refill:  0    Order Specific Question:   Supervising Provider    Answer:   Merrilee Jansky [id number]     *If you need refills on other medications prior to your next appointment, please contact your pharmacy*  Follow-Up: Call back or seek an in-person evaluation if the symptoms worsen or if the condition fails to  improve as anticipated.  [facility] [phone]  Other Instructions  -Take meds as prescribed -Rest -Use a cool mist humidifier especially during the winter months when heat dries out the air. - Use saline nose sprays frequently to help soothe nasal passages and promote drainage. -Saline irrigations of the nose can be very helpful if done frequently.             * 4X daily for 1 week*             * Use of a nettie pot can be helpful with this.  *Follow directions with this* *Boiled or distilled water only -stay hydrated by drinking plenty of fluids - Keep thermostat turn down low to prevent drying out sinuses - For any cough or congestion- robitussin DM or Delsym as needed - For fever or aches or pains- take tylenol or ibuprofen as directed on bottle             * for fevers greater than 101 orally you may alternate ibuprofen and tylenol every 3 hours.  If you do not improve you will need a follow up visit in person.                  If you have been instructed to have an in-person evaluation today at a local Urgent Care facility, please use the link  below. It will take you to a list of all of our available [facility], including address, phone number and hours of operation. Please do not delay care.  [facility]  If you or a family member do not have a primary care provider, use the link below to schedule a visit and establish care. When you choose a [facility] primary care physician or advanced practice provider, you gain a long-term partner in health. Find a Primary Care Provider  Learn more about [facility]'s in-office and virtual care options: [facility]

## 2022-05-30 ENCOUNTER — Ambulatory Visit (HOSPITAL_COMMUNITY)
Admission: RE | Admit: 2022-05-30 | Discharge: 2022-05-30 | Disposition: A | Discharge status: Home / Self Care | Payer: 59 | Source: Ambulatory Visit | Attending: Family Medicine | Admitting: Family Medicine

## 2022-05-30 DIAGNOSIS — Z1231 Encounter for screening mammogram for malignant neoplasm of breast: Secondary | ICD-10-CM | POA: Insufficient documentation

## 2022-07-30 DIAGNOSIS — Z0000 Encounter for general adult medical examination without abnormal findings: Secondary | ICD-10-CM | POA: Diagnosis not present

## 2022-07-30 DIAGNOSIS — F419 Anxiety disorder, unspecified: Secondary | ICD-10-CM | POA: Diagnosis not present

## 2022-07-30 DIAGNOSIS — E785 Hyperlipidemia, unspecified: Secondary | ICD-10-CM | POA: Diagnosis not present

## 2022-07-30 DIAGNOSIS — I10 Essential (primary) hypertension: Secondary | ICD-10-CM | POA: Diagnosis not present

## 2022-07-30 DIAGNOSIS — Z6826 Body mass index (BMI) 26.0-26.9, adult: Secondary | ICD-10-CM | POA: Diagnosis not present

## 2022-07-30 DIAGNOSIS — F329 Major depressive disorder, single episode, unspecified: Secondary | ICD-10-CM | POA: Diagnosis not present

## 2022-07-30 DIAGNOSIS — Z1331 Encounter for screening for depression: Secondary | ICD-10-CM | POA: Diagnosis not present

## 2022-07-30 DIAGNOSIS — E663 Overweight: Secondary | ICD-10-CM | POA: Diagnosis not present

## 2022-07-30 DIAGNOSIS — Z1231 Encounter for screening mammogram for malignant neoplasm of breast: Secondary | ICD-10-CM | POA: Diagnosis not present

## 2022-08-07 ENCOUNTER — Encounter: Payer: Self-pay | Admitting: *Deleted

## 2022-11-12 ENCOUNTER — Ambulatory Visit: Payer: 59 | Admitting: "Endocrinology

## 2022-11-12 ENCOUNTER — Encounter: Payer: Self-pay | Admitting: "Endocrinology

## 2022-11-12 NOTE — Progress Notes (Signed)
Endocrinology Consult Note                                            11/12/2022, 1:51 PM   Subjective:    Patient ID: Margaret [name], female    DOB: [birth year], PCP Assunta Found, MD   Past Medical History:  Diagnosis Date   Anxiety    Hypercalcemia    Hypercalcemia    Hyperlipidemia    Hypertension    Vitamin D deficiency    Past Surgical History:  Procedure Laterality Date   ABDOMINAL HYSTERECTOMY     CHOLECYSTECTOMY     FINGER SURGERY Right 10/2019   middle finger   TUBAL LIGATION  03/1996   Social History   Socioeconomic History   Marital status: Married    Spouse name: Not on file   Number of children: Not on file   Years of education: Not on file   Highest education level: Not on file  Occupational History   Not on file  Tobacco Use   Smoking status: Former    Current packs/day: 0.50    Types: Cigarettes   Smokeless tobacco: Not on file  Vaping Use   Vaping status: Every Day  Substance and Sexual Activity   Alcohol use: No   Drug use: No   Sexual activity: Not on file  Other Topics Concern   Not on file  Social History Narrative   Not on file   Social Determinants of Health   Financial Resource Strain: Not on file  Food Insecurity: Not on file  Transportation Needs: Not on file  Physical Activity: Not on file  Stress: Not on file  Social Connections: Not on file   Family History  Problem Relation Age of Onset   COPD Mother    Hyperlipidemia Mother    Osteoporosis Mother    Hypertension Father    Outpatient Encounter Medications as of 11/12/2022  Medication Sig   famotidine (PEPCID) 20 MG tablet Take 20 mg by mouth as needed for heartburn or indigestion.   rosuvastatin (CRESTOR) 10 MG tablet Take 10 mg by mouth daily.   ALPRAZolam (XANAX) 1 MG tablet Take 1-2 mg by mouth 4 (four) times daily as needed for anxiety or sleep. Anxiety.   buPROPion (WELLBUTRIN XL) 300 MG 24 hr tablet Take 300 mg by mouth daily.    losartan-hydrochlorothiazide (HYZAAR) 100-25 MG tablet Take 1 tablet by mouth daily.   [DISCONTINUED] fluticasone (FLONASE) 50 MCG/ACT nasal spray Place 2 sprays into both nostrils daily.   [DISCONTINUED] losartan (COZAAR) 50 MG tablet Take 50 mg by mouth daily.   No facility-administered encounter medications on file as of 11/12/2022.   ALLERGIES: No Known Allergies  VACCINATION STATUS:  There is no immunization history on file for this patient.  HPI Margaret [name] is 52 y.o. female who presents today with a medical history as above. she is being [name] in consultation for hypercalcemia requested by Assunta Found, MD.   Patient reports that she has history of hypercalcemia for approximately 10 years.  She denies any prior history of nephrolithiasis, osteoporosis, nor cardiac dysrhythmia.  She is not on calcium supplements.  She does not have any recorded bone density studies.  She does not have recent 24-hour urine calcium measurements.  Her most recent calcium in the serum was 12.0 mg per DL. Her other medical  problems include hypertension and hyperlipidemia.  She is on rosuvastatin, losartan and bupropion. She does not have acute complaints today.  She is a former smoker.   Review of Systems  Constitutional: +mildly fluctuating body weight, no fatigue, no subjective hyperthermia, no subjective hypothermia Eyes: no blurry vision, no xerophthalmia ENT: no sore throat, no nodules palpated in throat, no dysphagia/odynophagia, no hoarseness Cardiovascular: no Chest Pain, no Shortness of Breath, no palpitations, no leg swelling Respiratory: no cough, no shortness of breath Gastrointestinal: no Nausea/Vomiting/Diarhhea Musculoskeletal: no muscle/joint aches Skin: no rashes Neurological: no tremors, no numbness, no tingling, no dizziness Psychiatric: no depression, no anxiety  Objective:       11/12/2022    9:33 AM 10/03/2021   12:20 PM 10/21/2014    1:03 PM  Vitals with BMI  Height 5'  2"    Weight 158 lbs 10 oz    BMI 29    Systolic 130 114 914  Diastolic 82 80 101  Pulse 84 88 84    BP 130/82   Pulse 84   Ht 5\' 2"  (1.575 m)   Wt 158 lb 9.6 oz (71.9 kg)   BMI 29.01 kg/m   Wt Readings from Last 3 Encounters:  11/12/22 158 lb 9.6 oz (71.9 kg)  10/21/14 150 lb (68 kg)  03/08/14 148 lb (67.1 kg)    Physical Exam  Constitutional:  Body mass index is 29.01 kg/m.,  not in acute distress, normal state of mind Eyes: PERRLA, EOMI, no exophthalmos ENT: moist mucous membranes, no gross thyromegaly, no gross cervical lymphadenopathy Cardiovascular: normal precordial activity, Regular Rate and Rhythm, no Murmur/Rubs/Gallops Respiratory:  adequate breathing efforts, no gross chest deformity, Clear to auscultation bilaterally Gastrointestinal: abdomen soft, Non -tender, No distension, Bowel Sounds present, no gross organomegaly Musculoskeletal: no gross deformities, strength intact in all four extremities, no peripheral edema Skin: moist, warm, no rashes Neurological: no tremor with outstretched hands, Deep tendon reflexes normal in bilateral lower extremities.  CMP ( most recent) CMP     Component Value Date/Time   NA 139 10/21/2014 1025   K 3.9 10/21/2014 1025   CL 107 10/21/2014 1025   CO2 28 10/21/2014 1025   GLUCOSE 97 10/21/2014 1025   BUN 7 10/21/2014 1025   CREATININE 0.64 10/21/2014 1025   CALCIUM 9.8 10/21/2014 1025   PROT 6.8 03/08/2014 0849   ALBUMIN 3.7 03/08/2014 0849   AST 21 03/08/2014 0849   ALT 23 03/08/2014 0849   ALKPHOS 61 03/08/2014 0849   BILITOT 0.4 03/08/2014 0849   GFRNONAA >60 10/21/2014 1025   11/30/2022 labs show calcium 12 mg/dl     Assessment & Plan:   1. Hypercalcemia  - Margaret [name]  is being [name] at a kind request of Assunta Found, MD. - I have reviewed her available  records and clinically evaluated the patient. - Based on these reviews, she has 1 time documented hypercalcemia of 12.  Her records do not show  measurement of PTH. -I discussed potential complications of hypercalcemia and the need to complete workup and treatment accordingly.  - she will need a repeat,  more complete labs towards confirming the diagnosis.  She will need CMP, PTH/calcium, magnesium, phosphorus, 25-hydroxy vitamin D, 24-hour urine calcium measurement as well as bone density. If her workup indicates primary hyperparathyroidism, she will be considered for surgical treatment.  - I did not initiate any new prescriptions today. - she is advised to maintain close follow up with Assunta Found, MD for primary care  needs.   -Thank you for involving me in the care of this pleasant patient.  Time spent with the patient: 50  minutes, of which >50% was spent in  counseling her about her hypercalcemia and the rest in obtaining information about her symptoms, reviewing her previous labs/studies ( including abstractions from other facilities),  evaluations, and treatments,  and developing a plan to confirm diagnosis and long term treatment based on the latest standards of care/guidelines; and documenting her care.  Margaret [name] participated in the discussions, expressed understanding, and voiced agreement with the above plans.  All questions were answered to her satisfaction. she is encouraged to contact clinic should she have any questions or concerns prior to her return visit.  Follow up plan: Return in about 6 weeks (around 12/24/2022) for F/U with Pre-visit Labs, DXA Scan B4 NV.   Marquis Lunch, MD [facility] [address] [place], [place] [address] Phone: [phone]  Fax: [phone]     11/12/2022, 1:51 PM  This note was partially dictated with voice recognition software. Similar sounding words can be transcribed inadequately or may not  be corrected upon review.

## 2022-11-15 LAB — PTH, INTACT AND CALCIUM
Calcium: 11.1 mg/dL — ABNORMAL HIGH (ref 8.7–10.2)
PTH: 40 pg/mL (ref 15–65)

## 2022-11-15 LAB — MAGNESIUM: Magnesium: 2.1 mg/dL (ref 1.6–2.3)

## 2022-11-15 LAB — PHOSPHORUS: Phosphorus: 2.4 mg/dL — ABNORMAL LOW (ref 3.0–4.3)

## 2022-11-15 LAB — CALCIUM, URINE, 24 HOUR
Calcium, 24H Urine: 125 mg/(24.h) (ref 0–320)
Calcium, Urine: 8.3 mg/dL

## 2022-11-15 LAB — CREATININE, URINE, 24 HOUR
Creatinine, 24H Ur: 848 mg/(24.h) (ref 800–1800)
Creatinine, Urine: 56.5 mg/dL

## 2022-11-15 LAB — VITAMIN D 25 HYDROXY (VIT D DEFICIENCY, FRACTURES): Vit D, 25-Hydroxy: 36.3 ng/mL (ref 30.0–100.0)

## 2022-11-22 ENCOUNTER — Ambulatory Visit (HOSPITAL_COMMUNITY)
Admission: RE | Admit: 2022-11-22 | Discharge: 2022-11-22 | Disposition: A | Discharge status: Home / Self Care | Payer: 59 | Source: Ambulatory Visit | Attending: "Endocrinology | Admitting: "Endocrinology

## 2022-11-22 ENCOUNTER — Other Ambulatory Visit (HOSPITAL_COMMUNITY): Payer: 59

## 2022-12-12 ENCOUNTER — Encounter: Payer: Self-pay | Admitting: "Endocrinology

## 2022-12-12 ENCOUNTER — Ambulatory Visit: Payer: 59 | Admitting: "Endocrinology

## 2022-12-12 DIAGNOSIS — E210 Primary hyperparathyroidism: Secondary | ICD-10-CM | POA: Insufficient documentation

## 2022-12-12 NOTE — Progress Notes (Signed)
12/12/2022, 1:48 PM   Endocrinology follow-up note  Subjective:    Patient ID: Margaret [name], female    DOB: October 25, 1970, PCP Margaret Found, MD   Past Medical History:  Diagnosis Date   Anxiety    Hypercalcemia    Hypercalcemia    Hyperlipidemia    Hypertension    Vitamin D deficiency    Past Surgical History:  Procedure Laterality Date   ABDOMINAL HYSTERECTOMY     CHOLECYSTECTOMY     FINGER SURGERY Right 10/2019   middle finger   TUBAL LIGATION  03/1996   Social History   Socioeconomic History   Marital status: Married    Spouse name: Not on file   Number of children: Not on file   Years of education: Not on file   Highest education level: Not on file  Occupational History   Not on file  Tobacco Use   Smoking status: Former    Current packs/day: 0.50    Types: Cigarettes   Smokeless tobacco: Not on file  Vaping Use   Vaping status: Every Day  Substance and Sexual Activity   Alcohol use: No   Drug use: No   Sexual activity: Not on file  Other Topics Concern   Not on file  Social History Narrative   Not on file   Social Determinants of Health   Financial Resource Strain: Not on file  Food Insecurity: Not on file  Transportation Needs: Not on file  Physical Activity: Not on file  Stress: Not on file  Social Connections: Not on file   Family History  Problem Relation Age of Onset   COPD Mother    Hyperlipidemia Mother    Osteoporosis Mother    Hypertension Father    Outpatient Encounter Medications as of 12/12/2022  Medication Sig   ALPRAZolam (XANAX) 1 MG tablet Take 1-2 mg by mouth 4 (four) times daily as needed for anxiety or sleep. Anxiety.   buPROPion (WELLBUTRIN XL) 300 MG 24 hr tablet Take 300 mg by mouth daily.   famotidine (PEPCID) 20 MG tablet Take 20 mg by mouth as needed for heartburn or indigestion.   losartan-hydrochlorothiazide (HYZAAR) 100-25 MG tablet Take 1 tablet by mouth  daily.   rosuvastatin (CRESTOR) 10 MG tablet Take 10 mg by mouth daily.   No facility-administered encounter medications on file as of 12/12/2022.   ALLERGIES: No Known Allergies  VACCINATION STATUS:  There is no immunization history on file for this patient.  HPI Margaret [name] is 52 y.o. female who presents today with a medical history as above. she is being [name] in follow-up after she was [name] consultation for hypercalcemia requested by Margaret Found, MD.   Patient reports that she has history of hypercalcemia for approximately 10 years.  She denies any prior history of nephrolithiasis, osteoporosis, nor cardiac dysrhythmia.  She is not on calcium supplements.    She she was sent for 24-hour urine measurement as well as bone density after her last visit.  Her studies show osteopenia, however no significant hypercalciuria.  .  Her most recent calcium in the serum range between 11.1-12  mg per DL. Her other medical problems include hypertension and hyperlipidemia.  She is on rosuvastatin,  losartan and bupropion. She does not have acute complaints today.  She is a former smoker.   Review of Systems  Constitutional: +mildly fluctuating body weight, no fatigue, no subjective hyperthermia, no subjective hypothermia Eyes: no blurry vision, no xerophthalmia ENT: no sore throat, no nodules palpated in throat, no dysphagia/odynophagia, no hoarseness   Objective:       12/12/2022   11:20 AM 11/12/2022    9:33 AM 10/03/2021   12:20 PM  Vitals with BMI  Height 5\' 2"  5\' 2"    Weight 154 lbs 6 oz 158 lbs 10 oz   BMI 28.23 29   Systolic 106 130 161  Diastolic 64 82 80  Pulse 116 84 88    BP 106/64   Pulse (!) 116   Ht 5\' 2"  (1.575 m)   Wt 154 lb 6.4 oz (70 kg)   BMI 28.24 kg/m   Wt Readings from Last 3 Encounters:  12/12/22 154 lb 6.4 oz (70 kg)  11/12/22 158 lb 9.6 oz (71.9 kg)  10/21/14 150 lb (68 kg)    Physical Exam  Constitutional:  Body mass index is 28.24 kg/m.,  not  in acute distress, normal state of mind Eyes: PERRLA, EOMI, no exophthalmos ENT: moist mucous membranes, no gross thyromegaly, no gross cervical lymphadenopathy   CMP ( most recent) CMP     Component Value Date/Time   NA 139 10/21/2014 1025   K 3.9 10/21/2014 1025   CL 107 10/21/2014 1025   CO2 28 10/21/2014 1025   GLUCOSE 97 10/21/2014 1025   BUN 7 10/21/2014 1025   CREATININE 0.64 10/21/2014 1025   CALCIUM 11.1 (H) 11/14/2022 1122   PROT 6.8 03/08/2014 0849   ALBUMIN 3.7 03/08/2014 0849   AST 21 03/08/2014 0849   ALT 23 03/08/2014 0849   ALKPHOS 61 03/08/2014 0849   BILITOT 0.4 03/08/2014 0849   GFRNONAA >60 10/21/2014 1025   11/30/2022 labs show calcium 12 mg/dl  Recent Results (from the past 2160 hour(s))  Magnesium     Status: None   Collection Time: 11/14/22 11:22 AM  Result Value Ref Range   Magnesium 2.1 1.6 - 2.3 mg/dL  PTH, intact and calcium     Status: Abnormal   Collection Time: 11/14/22 11:22 AM  Result Value Ref Range   Calcium 11.1 (H) 8.7 - 10.2 mg/dL   PTH 40 15 - 65 pg/mL   PTH Interp Comment     Comment: Interpretation                 Intact PTH    Calcium                                 (pg/mL)      (mg/dL) Normal                          15 - 65     8.6 - 10.2 Primary Hyperparathyroidism         >65          >10.2 Secondary Hyperparathyroidism       >65          <10.2 Non-Parathyroid Hypercalcemia       <65          >10.2 Hypoparathyroidism                  <15          <  8.6 Non-Parathyroid Hypocalcemia    15 - 65          < 8.6   Phosphorus     Status: Abnormal   Collection Time: 11/14/22 11:22 AM  Result Value Ref Range   Phosphorus 2.4 (L) 3.0 - 4.3 mg/dL  VITAMIN D 25 Hydroxy (Vit-D Deficiency, Fractures)     Status: None   Collection Time: 11/14/22 11:22 AM  Result Value Ref Range   Vit D, 25-Hydroxy 36.3 30.0 - 100.0 ng/mL    Comment: Vitamin D deficiency has been defined by the Institute of Medicine and an Endocrine Society  practice guideline as a level of serum 25-OH vitamin D less than 20 ng/mL (1,2). The Endocrine Society went on to further define vitamin D insufficiency as a level between 21 and 29 ng/mL (2). 1. IOM (Institute of Medicine). 2010. Dietary reference    intakes for calcium and D. [place]: The    [employer]. 2. Holick MF, Binkley Cecil, Bischoff-Ferrari HA, et al.    Evaluation, treatment, and prevention of vitamin D    deficiency: an Endocrine Society clinical practice    guideline. JCEM. 2011 Jul; 96(7):1911-30.   Creatinine, urine, 24 hour     Status: None   Collection Time: 11/14/22  3:19 PM  Result Value Ref Range   Creatinine, Urine 56.5 Not Estab. mg/dL   Creatinine, 46N Ur 629 800 - 1,800 mg/24 hr  Calcium, urine, 24 hour     Status: None   Collection Time: 11/14/22  3:19 PM  Result Value Ref Range   Calcium, Urine 8.3 Not Estab. 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 DualFemur Total Left 11/22/2022 51.9 Osteopenia -1.9 0.773 g/cm2 - -   DualFemur Total Mean 11/22/2022 51.9 Osteopenia -1.7 0.788 g/cm2 - -   Right Forearm Radius 33% 11/22/2022 51.9 Osteopenia -1.2 0.630 g/cm2  Assessment & Plan:   1. Hypercalcemia 2.  Primary hyperparathyroidism  - Rye Dorado  is being [name] at a kind request of Margaret Found, MD. - I have reviewed her new and available  records and clinically evaluated the patient. - Based on these reviews, she has chronic hypercalcemia now likely due to primary hyperparathyroidism.  She has osteopenia as a complication.  Fractional excretion of calcium is 0.16 indicating FHH unlikely.  His workup is indicative of  primary hyperparathyroidism, she will be considered for surgical treatment. I will arrange for her to see Dr. Gerrit Friends for parathyroidectomy.  She will return to clinic in 5 weeks,  after her surgery with repeat labs.  - I did not initiate  any new prescriptions today. - she is advised to maintain close follow up with Margaret Found, MD for primary care needs.  I spent  22  minutes in the care of the patient today including review of labs from Thyroid Function, CMP, and other relevant labs ; imaging/biopsy records (current and previous including abstractions from other facilities); face-to-face time discussing  her lab results and symptoms, medications doses, her options of short and long term treatment based on the latest standards of care / guidelines;   and documenting the encounter.  Margaret [name]  participated in the discussions, expressed understanding, and voiced agreement with the above plans.  All questions were answered to her satisfaction. she is encouraged to contact clinic should she have any questions or concerns prior to her  return visit.   Follow up plan: Return in about 5 weeks (around 01/16/2023) for F/U with Labs after Surgery.   Marquis Lunch, MD [facility] [address] [place], [place] [address] Phone: [phone]  Fax: [phone]     12/12/2022, 1:48 PM  This note was partially dictated with voice recognition software. Similar sounding words can be transcribed inadequately or may not  be corrected upon review.

## 2022-12-20 DIAGNOSIS — E210 Primary hyperparathyroidism: Secondary | ICD-10-CM | POA: Diagnosis not present

## 2022-12-21 LAB — COMPREHENSIVE METABOLIC PANEL
ALT: 24 [IU]/L (ref 0–32)
AST: 18 [IU]/L (ref 0–40)
Albumin: 4.3 g/dL (ref 3.8–4.9)
Alkaline Phosphatase: 86 [IU]/L (ref 44–121)
BUN/Creatinine Ratio: 11 (ref 9–23)
BUN: 11 mg/dL (ref 6–24)
Bilirubin Total: 0.3 mg/dL (ref 0.0–1.2)
CO2: 25 mmol/L (ref 20–29)
Calcium: 10.8 mg/dL — ABNORMAL HIGH (ref 8.7–10.2)
Chloride: 102 mmol/L (ref 96–106)
Creatinine, Ser: 1.04 mg/dL — ABNORMAL HIGH (ref 0.57–1.00)
Globulin, Total: 2.6 g/dL (ref 1.5–4.5)
Glucose: 95 mg/dL (ref 70–99)
Potassium: 3.6 mmol/L (ref 3.5–5.2)
Sodium: 140 mmol/L (ref 134–144)
Total Protein: 6.9 g/dL (ref 6.0–8.5)
eGFR: 65 mL/min/{1.73_m2} (ref >59)

## 2022-12-21 LAB — PTH, INTACT AND CALCIUM: PTH: 36 pg/mL (ref 15–65)

## 2022-12-21 LAB — TSH: TSH: 1.2 u[IU]/mL (ref 0.450–4.500)

## 2022-12-21 LAB — T4, FREE: Free T4: 1.3 ng/dL (ref 0.82–1.77)

## 2022-12-23 ENCOUNTER — Telehealth: Payer: Self-pay | Admitting: "Endocrinology

## 2022-12-23 NOTE — Telephone Encounter (Signed)
Pt said she did all her labs/urine and since they are normal, she wants to know does she still need to see the surgeon

## 2022-12-25 DIAGNOSIS — E213 Hyperparathyroidism, unspecified: Secondary | ICD-10-CM | POA: Diagnosis not present

## 2022-12-25 DIAGNOSIS — Z6828 Body mass index (BMI) 28.0-28.9, adult: Secondary | ICD-10-CM | POA: Diagnosis not present

## 2022-12-25 DIAGNOSIS — Z23 Encounter for immunization: Secondary | ICD-10-CM | POA: Diagnosis not present

## 2022-12-25 DIAGNOSIS — F419 Anxiety disorder, unspecified: Secondary | ICD-10-CM | POA: Diagnosis not present

## 2022-12-26 NOTE — Telephone Encounter (Signed)
Sent pt mychart msg

## 2022-12-31 ENCOUNTER — Ambulatory Visit: Payer: 59 | Admitting: "Endocrinology

## 2023-01-09 DIAGNOSIS — E213 Hyperparathyroidism, unspecified: Secondary | ICD-10-CM | POA: Diagnosis not present

## 2023-01-09 DIAGNOSIS — M8580 Other specified disorders of bone density and structure, unspecified site: Secondary | ICD-10-CM | POA: Diagnosis not present

## 2023-01-10 ENCOUNTER — Other Ambulatory Visit (HOSPITAL_COMMUNITY): Payer: Self-pay | Admitting: Surgery

## 2023-01-10 ENCOUNTER — Other Ambulatory Visit: Payer: Self-pay | Admitting: Surgery

## 2023-01-10 DIAGNOSIS — E213 Hyperparathyroidism, unspecified: Secondary | ICD-10-CM

## 2023-01-16 ENCOUNTER — Encounter
Admission: RE | Admit: 2023-01-16 | Discharge: 2023-01-16 | Disposition: A | Discharge status: Home / Self Care | Payer: 59 | Source: Ambulatory Visit | Attending: Surgery | Admitting: Surgery

## 2023-01-16 ENCOUNTER — Ambulatory Visit
Admission: RE | Admit: 2023-01-16 | Discharge: 2023-01-16 | Disposition: A | Discharge status: Home / Self Care | Payer: 59 | Source: Ambulatory Visit | Attending: Surgery | Admitting: Surgery

## 2023-01-16 DIAGNOSIS — E213 Hyperparathyroidism, unspecified: Secondary | ICD-10-CM | POA: Insufficient documentation

## 2023-01-16 DIAGNOSIS — E041 Nontoxic single thyroid nodule: Secondary | ICD-10-CM | POA: Diagnosis not present

## 2023-01-16 MED ORDER — TECHNETIUM TC 99M SESTAMIBI - CARDIOLITE
25.0000 | Freq: Once | INTRAVENOUS | Status: AC
  Administered 2023-01-16: 26.44 via INTRAVENOUS

## 2023-01-20 NOTE — Progress Notes (Signed)
Sestamibi scan is negative.  USN shows thyroid nodules but no parathyroid adenoma.  Please schedule patient for a 4D-CT scan of the neck with parathyroid protocol as we discussed at the office.  Darnell Level, MD [facility] practice Office: [phone]

## 2023-01-21 ENCOUNTER — Other Ambulatory Visit: Payer: Self-pay | Admitting: Surgery

## 2023-01-21 ENCOUNTER — Encounter: Payer: Self-pay | Admitting: Surgery

## 2023-01-21 DIAGNOSIS — E213 Hyperparathyroidism, unspecified: Secondary | ICD-10-CM

## 2023-01-27 ENCOUNTER — Ambulatory Visit
Admission: RE | Admit: 2023-01-27 | Discharge: 2023-01-27 | Disposition: A | Discharge status: Home / Self Care | Payer: 59 | Source: Ambulatory Visit | Attending: Surgery | Admitting: Surgery

## 2023-01-27 DIAGNOSIS — E213 Hyperparathyroidism, unspecified: Secondary | ICD-10-CM

## 2023-01-27 MED ORDER — IOPAMIDOL (ISOVUE-370) INJECTION 76%
75.0000 mL | Freq: Once | INTRAVENOUS | Status: AC | PRN
  Administered 2023-01-27: 75 mL via INTRAVENOUS

## 2023-02-13 ENCOUNTER — Encounter: Payer: Self-pay | Admitting: *Deleted

## 2023-02-18 DIAGNOSIS — E041 Nontoxic single thyroid nodule: Secondary | ICD-10-CM | POA: Diagnosis not present

## 2023-02-18 DIAGNOSIS — E213 Hyperparathyroidism, unspecified: Secondary | ICD-10-CM | POA: Diagnosis not present

## 2023-03-10 ENCOUNTER — Ambulatory Visit: Payer: Self-pay | Admitting: Surgery

## 2023-03-10 DIAGNOSIS — F419 Anxiety disorder, unspecified: Secondary | ICD-10-CM | POA: Diagnosis not present

## 2023-03-10 NOTE — Progress Notes (Signed)
 Surgery orders requested via Epic inbox.

## 2023-03-14 NOTE — Patient Instructions (Signed)
 SURGICAL WAITING ROOM VISITATION  Patients having surgery or a procedure may have no more than 2 support people in the waiting area - these visitors may rotate.    Children under the age of 82 must have an adult with them who is not the patient.  Due to an increase in RSV and influenza rates and associated hospitalizations, children ages 39 and under may not visit patients in [facility] hospitals.  Visitors with respiratory illnesses are discouraged from visiting and should remain at home.  If the patient needs to stay at the hospital during part of their recovery, the visitor guidelines for inpatient rooms apply. Pre-op nurse will coordinate an appropriate time for 1 support person to accompany patient in pre-op.  This support person may not rotate.    Please refer to the [facility] website for the visitor guidelines for Inpatients (after your surgery is over and you are in a regular room).       Your procedure is scheduled on: 03/27/23   Report to [facility] Main Entrance    Report to admitting at 5:15 AM   Call this number if you have problems the morning of surgery [phone]   Do not eat food or drink liquids:After Midnight. Except sips of water  with meds                                                        Oral Hygiene is also important to reduce your risk of infection.                                    Remember - BRUSH YOUR TEETH THE MORNING OF SURGERY WITH YOUR REGULAR TOOTHPASTE   Stop all vitamins and herbal supplements 7 days before surgery.   Take these medicines the morning of surgery with A SIP OF WATER : Wellbutrin , famotidine , rosuvastatin             You may not have any metal on your body including hair pins, jewelry, and body piercing             Do not wear make-up, lotions, powders, perfumes/cologne, or deodorant  Do not wear nail polish including gel and S&S, artificial/acrylic nails, or any other type of covering on natural nails  including finger and toenails. If you have artificial nails, gel coating, etc. that needs to be removed by a nail salon please have this removed prior to surgery or surgery may need to be canceled/ delayed if the surgeon/ anesthesia feels like they are unable to be safely monitored.   Do not shave  48 hours prior to surgery.    Do not bring valuables to the hospital. [facility] IS NOT             RESPONSIBLE   FOR VALUABLES.   Contacts, glasses, dentures or bridgework may not be worn into surgery.   Bring small overnight bag day of surgery.   DO NOT BRING YOUR HOME MEDICATIONS TO THE HOSPITAL. PHARMACY WILL DISPENSE MEDICATIONS LISTED ON YOUR MEDICATION LIST TO YOU DURING YOUR ADMISSION IN THE HOSPITAL!    Patients discharged on the day of surgery will not be allowed to drive home.  Someone NEEDS to stay with you for  the first 24 hours after anesthesia.   Special Instructions: Bring a copy of your healthcare power of attorney and living will documents the day of surgery if you haven't scanned them before.              Please read over the following fact sheets you were given: IF YOU HAVE QUESTIONS ABOUT YOUR PRE-OP INSTRUCTIONS PLEASE CALL [phone] Margaret [name]   If you received a COVID test during your pre-op visit  it is requested that you wear a mask when out in public, stay away from anyone that may not be feeling well and notify your surgeon if you develop symptoms. If you test positive for Covid or have been in contact with anyone that has tested positive in the last 10 days please notify you surgeon.    [facility] - Preparing for Surgery Before surgery, you can play an important role.  Because skin is not sterile, your skin needs to be as free of germs as possible.  You can reduce the number of germs on your skin by washing with CHG (chlorahexidine gluconate) soap before surgery.  CHG is an antiseptic cleaner which kills germs and bonds with the skin to continue killing germs even  after washing. Please DO NOT use if you have an allergy to CHG or antibacterial soaps.  If your skin becomes reddened/irritated stop using the CHG and inform your nurse when you arrive at Short Stay. Do not shave (including legs and underarms) for at least 48 hours prior to the first CHG shower.  You may shave your face/neck.  Please follow these instructions carefully:  1.  Shower with CHG Soap the night before surgery and the  morning of surgery.  2.  If you choose to wash your hair, wash your hair first as usual with your normal  shampoo.  3.  After you shampoo, rinse your hair and body thoroughly to remove the shampoo.                             4.  Use CHG as you would any other liquid soap.  You can apply chg directly to the skin and wash.  Gently with a scrungie or clean washcloth.  5.  Apply the CHG Soap to your body ONLY FROM THE NECK DOWN.   Do   not use on face/ open                           Wound or open sores. Avoid contact with eyes, ears mouth and   genitals (private parts).                       Wash face,  Genitals (private parts) with your normal soap.             6.  Wash thoroughly, paying special attention to the area where your    surgery  will be performed.  7.  Thoroughly rinse your body with warm water  from the neck down.  8.  DO NOT shower/wash with your normal soap after using and rinsing off the CHG Soap.                9.  Pat yourself dry with a clean towel.            10.  Wear clean pajamas.  11.  Place clean sheets on your bed the night of your first shower and do not  sleep with pets. Day of Surgery : Do not apply any lotions/deodorants the morning of surgery.  Please wear clean clothes to the hospital/surgery center.  FAILURE TO FOLLOW THESE INSTRUCTIONS MAY RESULT IN THE CANCELLATION OF YOUR SURGERY  PATIENT SIGNATURE_________________________________  NURSE  SIGNATURE__________________________________  ________________________________________________________________________

## 2023-03-14 NOTE — Progress Notes (Addendum)
 COVID Vaccine received:  []  No [x]  Yes Date of any COVID positive Test in last 90 days: no PCP - Minus Amel MD Cardiologist - n/a  Chest x-ray -  EKG -  03/17/23 Epic Stress Test -  ECHO -  Cardiac Cath -   Bowel Prep - [x]  No  []   Yes ______  Pacemaker / ICD device [x]  No []  Yes   Spinal Cord Stimulator:[x]  No []  Yes       History of Sleep Apnea? [x]  No []  Yes   CPAP used?- [x]  No []  Yes    Does the patient monitor blood sugar?          [x]  No []  Yes  []  N/A  Patient has: [x]  NO Hx DM   []  Pre-DM                 []  DM1  []   DM2 Does patient have a [employer] or [employer]om? []  No []  Yes   Fasting Blood Sugar Ranges-  Checks Blood Sugar _____ times a day  GLP1 agonist / usual dose - no GLP1 instructions:  SGLT-2 inhibitors / usual dose -no  SGLT-2 instructions:   Blood Thinner / Instructions:no Aspirin Instructions:no  Comments:   Activity level: Patient is able  to climb a flight of stairs without difficulty; [x]  No CP  [x]  No SOB, _   Patient can perform ADLs without assistance.   Anesthesia review:   Patient denies shortness of breath, fever, cough and chest pain at PAT appointment.  Patient verbalized understanding and agreement to the Pre-Surgical Instructions that were given to them at this PAT appointment. Patient was also educated of the need to review these PAT instructions again prior to his/her surgery.I reviewed the appropriate phone numbers to call if they have any and questions or concerns.

## 2023-03-17 ENCOUNTER — Encounter (HOSPITAL_COMMUNITY): Payer: Self-pay

## 2023-03-17 ENCOUNTER — Other Ambulatory Visit: Payer: Self-pay

## 2023-03-17 ENCOUNTER — Encounter (HOSPITAL_COMMUNITY)
Admission: RE | Admit: 2023-03-17 | Discharge: 2023-03-17 | Disposition: A | Discharge status: Home / Self Care | Payer: 59 | Source: Ambulatory Visit | Attending: Surgery | Admitting: Surgery

## 2023-03-17 VITALS — BP 129/84 | HR 99 | Temp 98.5°F | Resp 16 | Ht 62.0 in | Wt 158.0 lb

## 2023-03-17 DIAGNOSIS — I10 Essential (primary) hypertension: Secondary | ICD-10-CM | POA: Diagnosis not present

## 2023-03-17 DIAGNOSIS — Z01818 Encounter for other preprocedural examination: Secondary | ICD-10-CM | POA: Insufficient documentation

## 2023-03-17 HISTORY — DX: Depression, unspecified: F32.A

## 2023-03-17 LAB — CBC
HCT: 37.6 % (ref 36.0–46.0)
Hemoglobin: 12.5 g/dL (ref 12.0–15.0)
MCH: 28.3 pg (ref 26.0–34.0)
MCHC: 33.2 g/dL (ref 30.0–36.0)
MCV: 85.1 fL (ref 80.0–100.0)
Platelets: 265 10*3/uL (ref 150–400)
RBC: 4.42 MIL/uL (ref 3.87–5.11)
RDW: 12.9 % (ref 11.5–15.5)
WBC: 7.8 10*3/uL (ref 4.0–10.5)
nRBC: 0 % (ref 0.0–0.2)

## 2023-03-17 LAB — BASIC METABOLIC PANEL
Anion gap: 8 (ref 5–15)
BUN: 13 mg/dL (ref 6–20)
CO2: 25 mmol/L (ref 22–32)
Calcium: 10.2 mg/dL (ref 8.9–10.3)
Chloride: 106 mmol/L (ref 98–111)
Creatinine, Ser: 1.08 mg/dL — ABNORMAL HIGH (ref 0.44–1.00)
GFR, Estimated: >60 mL/min (ref >60)
Glucose, Bld: 88 mg/dL (ref 70–99)
Potassium: 3.6 mmol/L (ref 3.5–5.1)
Sodium: 139 mmol/L (ref 135–145)

## 2023-03-20 ENCOUNTER — Encounter (HOSPITAL_COMMUNITY): Payer: Self-pay | Admitting: Surgery

## 2023-03-20 NOTE — H&P (Signed)
PROVIDER: Isaiahs Chancy Myra Rude, MD   Chief Complaint: Follow-up (Primary hyperparathyroidism)  History of Present Illness:  Patient returns today to discuss results of her diagnostic imaging studies. Her husband is available on telephone call during the office visit. We reviewed her ultrasound, nuclear medicine parathyroid scan with sestamibi, and her CT scan of the neck. I provided her with copies of these reports. All of these were negative for parathyroid adenoma. Today we discussed the next steps for management.   Patient did have a right sided thyroid nodule noted on ultrasound. This met criteria for continued follow-up. We will plan a repeat ultrasound and TSH level in 1 year for follow-up.  Review of Systems: A complete review of systems was obtained from the patient. I have reviewed this information and discussed as appropriate with the patient. See HPI as well for other ROS.  Review of Systems  Constitutional: Positive for malaise/fatigue.  HENT: Negative.  Eyes: Negative.  Respiratory: Negative.  Cardiovascular: Negative.  Gastrointestinal: Negative.  Genitourinary: Negative.  Musculoskeletal: Positive for joint pain.  Skin: Negative.  Neurological: Negative.  Endo/Heme/Allergies: Negative.  Psychiatric/Behavioral: Negative.    Medical History: Past Medical History:  Diagnosis Date  Hyperlipidemia  Hypertension   Patient Active Problem List  Diagnosis  Hyperparathyroidism (CMS/HHS-HCC)  Osteopenia  Right thyroid nodule   Past Surgical History:  Procedure Laterality Date  CHOLECYSTECTOMY  HYSTERECTOMY  LAPAROSCOPIC TUBAL LIGATION    No Known Allergies  Current Outpatient Medications on File Prior to Visit  Medication Sig Dispense Refill  ALPRAZolam (XANAX) 1 MG tablet Take 1 mg by mouth 4 (four) times daily  buPROPion (WELLBUTRIN XL) 300 MG XL tablet Take 300 mg by mouth once daily  famotidine (PEPCID) 20 MG tablet Take 20 mg by mouth   losartan-hydroCHLOROthiazide (HYZAAR) 100-25 mg tablet Take 1 tablet by mouth once daily  rosuvastatin (CRESTOR) 10 MG tablet Take 10 mg by mouth once daily   No current facility-administered medications on file prior to visit.   Family History  Problem Relation Age of Onset  Hyperlipidemia (Elevated cholesterol) Mother  High blood pressure (Hypertension) Father  Deep vein thrombosis (DVT or abnormal blood clot formation) Father  Colon cancer Father    Social History   Tobacco Use  Smoking Status Unknown  Smokeless Tobacco Not on file    Social History   Socioeconomic History  Marital status: Married  Tobacco Use  Smoking status: Unknown  Vaping Use  Vaping status: Every Day  Substance and Sexual Activity  Alcohol use: Not Currently  Drug use: Never   Social Drivers of Health   Housing Stability: Unknown (02/18/2023)  Housing Stability Vital Sign  Homeless in the Last Year: No   Objective:   Vitals:  PainSc: 0-No pain   There is no height or weight on file to calculate BMI.  Physical Exam   Limited examination. Anterior neck is normal and symmetric. Palpation shows no significant nodules in the thyroid bed.   Assessment and Plan:   Hyperparathyroidism (CMS/HHS-HCC) Right thyroid nodule  Today we reviewed the results of her diagnostic studies including her ultrasound, nuclear medicine parathyroid scan with sestamibi, and 4D CT scan of the neck with parathyroid protocol. All of these failed to reveal any evidence of parathyroid adenoma. However, her laboratory studies are convincing and she remains symptomatic with a history of bone loss.  I have recommended proceeding with neck exploration with parathyroidectomy. Today we discussed this procedure. We discussed the size and location of the  incision. We discussed the potential hospital stay to be anticipated. We discussed her postoperative recovery. We discussed the chances of success. We discussed the small  chance of 4 gland hyperplasia. We discussed the very small chance of an ectopic parathyroid gland. Patient understands and agrees to proceed with neck exploration and parathyroidectomy. I believe the chances of success with this approach are very good.  We will also make note that she will require follow-up in 1 year with a repeat ultrasound and TSH level followed by physical exam for a small right sided thyroid nodule.   Darnell Level, MD [facility] practice Office: [phone]

## 2023-03-27 ENCOUNTER — Other Ambulatory Visit: Payer: Self-pay

## 2023-03-27 ENCOUNTER — Ambulatory Visit (HOSPITAL_COMMUNITY)
Admission: RE | Admit: 2023-03-27 | Discharge: 2023-03-28 | Disposition: A | Discharge status: Home / Self Care | Payer: 59 | Source: Ambulatory Visit | Attending: Surgery | Admitting: Surgery

## 2023-03-27 ENCOUNTER — Encounter (HOSPITAL_COMMUNITY): Payer: Self-pay | Admitting: Surgery

## 2023-03-27 ENCOUNTER — Encounter (HOSPITAL_COMMUNITY)
Admission: RE | Disposition: A | Discharge status: Home / Self Care | Payer: Self-pay | Source: Ambulatory Visit | Attending: Surgery

## 2023-03-27 ENCOUNTER — Ambulatory Visit (HOSPITAL_BASED_OUTPATIENT_CLINIC_OR_DEPARTMENT_OTHER): Payer: 59 | Admitting: Anesthesiology

## 2023-03-27 ENCOUNTER — Ambulatory Visit (HOSPITAL_COMMUNITY): Payer: 59 | Admitting: Anesthesiology

## 2023-03-27 DIAGNOSIS — E210 Primary hyperparathyroidism: Secondary | ICD-10-CM | POA: Diagnosis present

## 2023-03-27 DIAGNOSIS — F419 Anxiety disorder, unspecified: Secondary | ICD-10-CM | POA: Diagnosis not present

## 2023-03-27 DIAGNOSIS — K5900 Constipation, unspecified: Secondary | ICD-10-CM | POA: Insufficient documentation

## 2023-03-27 DIAGNOSIS — E041 Nontoxic single thyroid nodule: Secondary | ICD-10-CM | POA: Diagnosis not present

## 2023-03-27 DIAGNOSIS — Z79899 Other long term (current) drug therapy: Secondary | ICD-10-CM | POA: Diagnosis not present

## 2023-03-27 DIAGNOSIS — I10 Essential (primary) hypertension: Secondary | ICD-10-CM | POA: Insufficient documentation

## 2023-03-27 DIAGNOSIS — F32A Depression, unspecified: Secondary | ICD-10-CM | POA: Diagnosis not present

## 2023-03-27 DIAGNOSIS — D34 Benign neoplasm of thyroid gland: Secondary | ICD-10-CM | POA: Diagnosis not present

## 2023-03-27 HISTORY — PX: PARATHYROIDECTOMY: SHX19

## 2023-03-27 SURGERY — PARATHYROIDECTOMY
Anesthesia: General

## 2023-03-27 MED ORDER — ACETAMINOPHEN 325 MG PO TABS: 650.0000 mg | ORAL_TABLET | Freq: Four times a day (QID) | ORAL | Status: DC | PRN

## 2023-03-27 MED ORDER — OXYCODONE HCL 5 MG PO TABS
5.0000 mg | ORAL_TABLET | Freq: Once | ORAL | Status: AC | PRN
  Administered 2023-03-27: 5 mg via ORAL

## 2023-03-27 MED ORDER — SODIUM CHLORIDE 0.45 % IV SOLN: INTRAVENOUS | Status: DC

## 2023-03-27 MED ORDER — CHLORHEXIDINE GLUCONATE CLOTH 2 % EX PADS: 6.0000 | MEDICATED_PAD | Freq: Once | CUTANEOUS | Status: DC

## 2023-03-27 MED ORDER — SODIUM CHLORIDE 0.9 % IV SOLN: 12.5000 mg | INTRAVENOUS | Status: DC | PRN

## 2023-03-27 MED ORDER — BUPIVACAINE HCL 0.25 % IJ SOLN
INTRAMUSCULAR | Status: AC
  Filled 2023-03-27: qty 1

## 2023-03-27 MED ORDER — PROPOFOL 500 MG/50ML IV EMUL
INTRAVENOUS | Status: AC
  Filled 2023-03-27: qty 50

## 2023-03-27 MED ORDER — HEMOSTATIC AGENTS (NO CHARGE) OPTIME
TOPICAL | Status: DC | PRN
  Administered 2023-03-27: 1 via TOPICAL

## 2023-03-27 MED ORDER — ROCURONIUM BROMIDE 100 MG/10ML IV SOLN
INTRAVENOUS | Status: DC | PRN
  Administered 2023-03-27: 70 mg via INTRAVENOUS

## 2023-03-27 MED ORDER — HYDROMORPHONE HCL 1 MG/ML IJ SOLN
0.2500 mg | INTRAMUSCULAR | Status: DC | PRN
  Administered 2023-03-27: 0.5 mg via INTRAVENOUS
  Administered 2023-03-27: 0.25 mg via INTRAVENOUS

## 2023-03-27 MED ORDER — LOSARTAN POTASSIUM-HCTZ 100-25 MG PO TABS: 1.0000 | ORAL_TABLET | Freq: Every day | ORAL | Status: DC

## 2023-03-27 MED ORDER — HYDROMORPHONE HCL 1 MG/ML IJ SOLN: 1.0000 mg | INTRAMUSCULAR | Status: DC | PRN

## 2023-03-27 MED ORDER — OXYCODONE HCL 5 MG PO TABS
ORAL_TABLET | ORAL | Status: AC
  Filled 2023-03-27: qty 1

## 2023-03-27 MED ORDER — ONDANSETRON 4 MG PO TBDP: 4.0000 mg | ORAL_TABLET | Freq: Four times a day (QID) | ORAL | Status: DC | PRN

## 2023-03-27 MED ORDER — OXYCODONE HCL 5 MG PO TABS
5.0000 mg | ORAL_TABLET | ORAL | Status: DC | PRN
  Administered 2023-03-27 (×2): 10 mg via ORAL
  Filled 2023-03-27 (×2): qty 1
  Filled 2023-03-27: qty 2

## 2023-03-27 MED ORDER — ALPRAZOLAM 0.5 MG PO TABS: 1.0000 mg | ORAL_TABLET | Freq: Four times a day (QID) | ORAL | Status: DC | PRN

## 2023-03-27 MED ORDER — DIPHENHYDRAMINE HCL 25 MG PO CAPS: 50.0000 mg | ORAL_CAPSULE | Freq: Every evening | ORAL | Status: DC | PRN

## 2023-03-27 MED ORDER — DEXAMETHASONE SODIUM PHOSPHATE 10 MG/ML IJ SOLN
INTRAMUSCULAR | Status: AC
  Filled 2023-03-27: qty 1

## 2023-03-27 MED ORDER — LACTATED RINGERS IV SOLN: INTRAVENOUS | Status: DC

## 2023-03-27 MED ORDER — MEPERIDINE HCL 50 MG/ML IJ SOLN: 6.2500 mg | INTRAMUSCULAR | Status: DC | PRN

## 2023-03-27 MED ORDER — PROPOFOL 10 MG/ML IV BOLUS
INTRAVENOUS | Status: AC
  Filled 2023-03-27: qty 20

## 2023-03-27 MED ORDER — CHLORHEXIDINE GLUCONATE 0.12 % MT SOLN
15.0000 mL | Freq: Once | OROMUCOSAL | Status: AC
  Administered 2023-03-27: 15 mL via OROMUCOSAL

## 2023-03-27 MED ORDER — LIDOCAINE HCL (PF) 2 % IJ SOLN
INTRAMUSCULAR | Status: AC
  Filled 2023-03-27: qty 5

## 2023-03-27 MED ORDER — LOSARTAN POTASSIUM 50 MG PO TABS
100.0000 mg | ORAL_TABLET | Freq: Every day | ORAL | Status: DC
  Administered 2023-03-27: 100 mg via ORAL
  Filled 2023-03-27: qty 2

## 2023-03-27 MED ORDER — ONDANSETRON HCL 4 MG/2ML IJ SOLN
INTRAMUSCULAR | Status: AC
  Filled 2023-03-27: qty 2

## 2023-03-27 MED ORDER — PROPOFOL 10 MG/ML IV BOLUS
INTRAVENOUS | Status: DC | PRN
  Administered 2023-03-27: 20 mg via INTRAVENOUS
  Administered 2023-03-27: 150 mg via INTRAVENOUS

## 2023-03-27 MED ORDER — BUPIVACAINE HCL 0.25 % IJ SOLN
INTRAMUSCULAR | Status: DC | PRN
  Administered 2023-03-27: 10 mL

## 2023-03-27 MED ORDER — HYDROMORPHONE HCL 1 MG/ML IJ SOLN
INTRAMUSCULAR | Status: AC
  Administered 2023-03-27: 0.25 mg via INTRAVENOUS
  Filled 2023-03-27: qty 1

## 2023-03-27 MED ORDER — PHENYLEPHRINE HCL (PRESSORS) 10 MG/ML IV SOLN
INTRAVENOUS | Status: DC | PRN
  Administered 2023-03-27: 80 ug via INTRAVENOUS

## 2023-03-27 MED ORDER — ONDANSETRON HCL 4 MG/2ML IJ SOLN
INTRAMUSCULAR | Status: DC | PRN
  Administered 2023-03-27: 4 mg via INTRAVENOUS

## 2023-03-27 MED ORDER — HYDROCHLOROTHIAZIDE 25 MG PO TABS
25.0000 mg | ORAL_TABLET | Freq: Every day | ORAL | Status: DC
  Administered 2023-03-27: 25 mg via ORAL
  Filled 2023-03-27: qty 1

## 2023-03-27 MED ORDER — DEXAMETHASONE SODIUM PHOSPHATE 10 MG/ML IJ SOLN
INTRAMUSCULAR | Status: DC | PRN
  Administered 2023-03-27: 8 mg via INTRAVENOUS

## 2023-03-27 MED ORDER — CEFAZOLIN SODIUM-DEXTROSE 2-4 GM/100ML-% IV SOLN
2.0000 g | INTRAVENOUS | Status: AC
  Administered 2023-03-27: 2 g via INTRAVENOUS
  Filled 2023-03-27: qty 100

## 2023-03-27 MED ORDER — PROPOFOL 500 MG/50ML IV EMUL
INTRAVENOUS | Status: DC | PRN
  Administered 2023-03-27: 25 ug/kg/min via INTRAVENOUS

## 2023-03-27 MED ORDER — FENTANYL CITRATE (PF) 250 MCG/5ML IJ SOLN
INTRAMUSCULAR | Status: AC
  Filled 2023-03-27: qty 5

## 2023-03-27 MED ORDER — AMISULPRIDE (ANTIEMETIC) 5 MG/2ML IV SOLN: 10.0000 mg | Freq: Once | INTRAVENOUS | Status: DC | PRN

## 2023-03-27 MED ORDER — MIDAZOLAM HCL 2 MG/2ML IJ SOLN
INTRAMUSCULAR | Status: AC
  Filled 2023-03-27: qty 2

## 2023-03-27 MED ORDER — ROCURONIUM BROMIDE 10 MG/ML (PF) SYRINGE
PREFILLED_SYRINGE | INTRAVENOUS | Status: AC
  Filled 2023-03-27: qty 10

## 2023-03-27 MED ORDER — STERILE WATER FOR IRRIGATION IR SOLN
Status: DC | PRN
Start: 2023-03-27 — End: 2023-03-27
  Administered 2023-03-27: 1000 mL

## 2023-03-27 MED ORDER — OXYCODONE HCL 5 MG/5ML PO SOLN: 5.0000 mg | Freq: Once | ORAL | Status: AC | PRN

## 2023-03-27 MED ORDER — ONDANSETRON HCL 4 MG/2ML IJ SOLN: 4.0000 mg | Freq: Four times a day (QID) | INTRAMUSCULAR | Status: DC | PRN

## 2023-03-27 MED ORDER — 0.9 % SODIUM CHLORIDE (POUR BTL) OPTIME
TOPICAL | Status: DC | PRN
  Administered 2023-03-27: 1000 mL

## 2023-03-27 MED ORDER — DEXMEDETOMIDINE HCL IN NACL 80 MCG/20ML IV SOLN
INTRAVENOUS | Status: DC | PRN
  Administered 2023-03-27: 4 ug via INTRAVENOUS

## 2023-03-27 MED ORDER — FAMOTIDINE 20 MG PO TABS: 20.0000 mg | ORAL_TABLET | Freq: Every day | ORAL | Status: DC

## 2023-03-27 MED ORDER — BUPROPION HCL ER (XL) 150 MG PO TB24: 300.0000 mg | ORAL_TABLET | Freq: Every day | ORAL | Status: DC

## 2023-03-27 MED ORDER — ORAL CARE MOUTH RINSE: 15.0000 mL | Freq: Once | OROMUCOSAL | Status: AC

## 2023-03-27 MED ORDER — FENTANYL CITRATE (PF) 100 MCG/2ML IJ SOLN
INTRAMUSCULAR | Status: DC | PRN
  Administered 2023-03-27: 25 ug via INTRAVENOUS
  Administered 2023-03-27: 50 ug via INTRAVENOUS
  Administered 2023-03-27 (×2): 25 ug via INTRAVENOUS
  Administered 2023-03-27: 50 ug via INTRAVENOUS
  Administered 2023-03-27: 25 ug via INTRAVENOUS
  Administered 2023-03-27: 50 ug via INTRAVENOUS

## 2023-03-27 MED ORDER — ACETAMINOPHEN 650 MG RE SUPP: 650.0000 mg | Freq: Four times a day (QID) | RECTAL | Status: DC | PRN

## 2023-03-27 MED ORDER — TRAMADOL HCL 50 MG PO TABS
50.0000 mg | ORAL_TABLET | Freq: Four times a day (QID) | ORAL | Status: DC | PRN
  Administered 2023-03-28: 50 mg via ORAL
  Filled 2023-03-27: qty 1

## 2023-03-27 MED ORDER — MIDAZOLAM HCL 5 MG/5ML IJ SOLN
INTRAMUSCULAR | Status: DC | PRN
  Administered 2023-03-27 (×2): 1 mg via INTRAVENOUS

## 2023-03-27 MED ORDER — SUGAMMADEX SODIUM 200 MG/2ML IV SOLN
INTRAVENOUS | Status: DC | PRN
  Administered 2023-03-27: 200 mg via INTRAVENOUS

## 2023-03-27 MED ORDER — PHENYLEPHRINE 80 MCG/ML (10ML) SYRINGE FOR IV PUSH (FOR BLOOD PRESSURE SUPPORT)
PREFILLED_SYRINGE | INTRAVENOUS | Status: AC
  Filled 2023-03-27: qty 10

## 2023-03-27 SURGICAL SUPPLY — 30 items
ATTRACTOMAT 16X20 MAGNETIC DRP (DRAPES) ×1 IMPLANT
BAG COUNTER SPONGE SURGICOUNT (BAG) ×1 IMPLANT
BLADE SURG 15 STRL LF DISP TIS (BLADE) ×1 IMPLANT
CHLORAPREP W/TINT 26 (MISCELLANEOUS) ×1 IMPLANT
CLIP TI MEDIUM 6 (CLIP) ×2 IMPLANT
CLIP TI WIDE RED SMALL 6 (CLIP) ×2 IMPLANT
CLIP VESOCCLUDE SM WIDE 6/CT (CLIP) IMPLANT
COVER SURGICAL LIGHT HANDLE (MISCELLANEOUS) ×1 IMPLANT
DERMABOND ADVANCED .7 DNX12 (GAUZE/BANDAGES/DRESSINGS) ×1 IMPLANT
DRAPE LAPAROTOMY T 98X78 PEDS (DRAPES) ×1 IMPLANT
DRAPE UTILITY XL STRL (DRAPES) ×1 IMPLANT
ELECT REM PT RETURN 15FT ADLT (MISCELLANEOUS) ×1 IMPLANT
GAUZE 4X4 16PLY ~~LOC~~+RFID DBL (SPONGE) ×1 IMPLANT
GLOVE SURG ORTHO 8.0 STRL STRW (GLOVE) ×1 IMPLANT
GOWN STRL REUS W/ TWL XL LVL3 (GOWN DISPOSABLE) ×3 IMPLANT
HEMOSTAT SURGICEL 2X4 FIBR (HEMOSTASIS) ×1 IMPLANT
ILLUMINATOR WAVEGUIDE N/F (MISCELLANEOUS) IMPLANT
KIT BASIN OR (CUSTOM PROCEDURE TRAY) ×1 IMPLANT
KIT TURNOVER KIT A (KITS) IMPLANT
NDL HYPO 22X1.5 SAFETY MO (MISCELLANEOUS) ×1 IMPLANT
NEEDLE HYPO 22X1.5 SAFETY MO (MISCELLANEOUS) ×1 IMPLANT
PACK BASIC VI WITH GOWN DISP (CUSTOM PROCEDURE TRAY) ×1 IMPLANT
PENCIL SMOKE EVACUATOR (MISCELLANEOUS) ×1 IMPLANT
SHEARS HARMONIC 9CM CVD (BLADE) IMPLANT
SUT MNCRL AB 4-0 PS2 18 (SUTURE) ×1 IMPLANT
SUT VIC AB 3-0 SH 18 (SUTURE) ×1 IMPLANT
SYR BULB IRRIG 60ML STRL (SYRINGE) ×1 IMPLANT
SYR CONTROL 10ML LL (SYRINGE) ×1 IMPLANT
TOWEL OR 17X26 10 PK STRL BLUE (TOWEL DISPOSABLE) ×1 IMPLANT
TUBING CONNECTING 10 (TUBING) ×1 IMPLANT

## 2023-03-27 NOTE — Anesthesia Preprocedure Evaluation (Addendum)
 Anesthesia Evaluation  Patient identified by MRN, date of birth, ID band Patient awake    Reviewed: Allergy & Precautions, H&P , NPO status , Patient's Chart, lab work & pertinent test results  Airway Mallampati: II  TM Distance: >3 FB Neck ROM: Full    Dental  (+) Edentulous Upper, Edentulous Lower   Pulmonary Current SmokerPatient did not abstain from smoking.   Pulmonary exam normal breath sounds clear to auscultation       Cardiovascular hypertension, Pt. on medications Normal cardiovascular exam Rhythm:Regular Rate:Normal     Neuro/Psych   Anxiety Depression    negative neurological ROS  negative psych ROS   GI/Hepatic negative GI ROS, Neg liver ROS,,,  Endo/Other  negative endocrine ROS    Renal/GU negative Renal ROS  negative genitourinary   Musculoskeletal negative musculoskeletal ROS (+)    Abdominal   Peds negative pediatric ROS (+)  Hematology negative hematology ROS (+)   Anesthesia Other Findings   Reproductive/Obstetrics negative OB ROS                             Anesthesia Physical Anesthesia Plan  ASA: 2  Anesthesia Plan: General   Post-op Pain Management:    Induction: Intravenous  PONV Risk Score and Plan: 2 and Ondansetron, Midazolam and Treatment may vary due to age or medical condition  Airway Management Planned: Oral ETT  Additional Equipment:   Intra-op Plan:   Post-operative Plan: Extubation in OR  Informed Consent: I have reviewed the patients History and Physical, chart, labs and discussed the procedure including the risks, benefits and alternatives for the proposed anesthesia with the patient or authorized representative who has indicated his/her understanding and acceptance.     Dental advisory given  Plan Discussed with: CRNA  Anesthesia Plan Comments:        Anesthesia Quick Evaluation

## 2023-03-27 NOTE — Op Note (Signed)
 Operative Note  Pre-operative Diagnosis:  primary hyperparathyroidism  Post-operative Diagnosis:  same  Surgeon:  Darnell Level, MD  Assistant:  none   Procedure:  Neck exploration, left inferior parathyroidectomy  Anesthesia:  general  Estimated Blood Loss:  15 cc  Drains: none         Specimen: left inferior parathyroid to pathology  Indications:  Patient is referred by Dr. Marquis Lunch for surgical evaluation and management of primary hyperparathyroidism. Patient has had longstanding hypercalcemia. She was originally diagnosed in 2015. She actually was seen in my practice and underwent ultrasound exam and sestamibi scan in December 2015 and January 2016. Subsequently she was started on vitamin D and did not return for further follow-up. Recent laboratory studies show calcium levels of 11.1 and reportedly as high as 12.0. Intact PTH level is unsuppressed at 36. 24-hour urine collection for calcium is normal at 125. Patient is symptomatic with severe fatigue. She has bone and joint discomfort. She complains of constipation and urinary frequency. She has had issues with depression and memory. There is no family history of parathyroid disease or other endocrine neoplasms.  Patient underwent imaging studies including an ultrasound examination of the neck, a nuclear medicine parathyroid scan with sestamibi, and a 4D CT scan with parathyroid protocol.  All of these studies were negative for evidence of parathyroid adenoma.  Patient now comes to surgery for neck exploration and parathyroidectomy.  Procedure:  The patient was seen in the pre-op holding area. The risks, benefits, complications, treatment options, and expected outcomes were previously discussed with the patient. The patient agreed with the proposed plan and has signed the informed consent form.  The patient was brought to the operating room by the surgical team, identified as [name] and the procedure verified. A "time out" was  completed and the above information confirmed.  Following administration of general anesthesia, the patient was positioned and then prepped and draped in the usual aseptic fashion.  After ascertaining that an adequate level of anesthesia been achieved, a small Kocher incision is made centrally in the lower neck.  Dissection is carried through subcutaneous tissues and platysma.  Hemostasis is achieved with the electrocautery.  Subplatysmal flaps are developed cephalad and caudad.  Self-retaining retractors are placed for exposure.  Strap muscles are incised in the midline and dissection has begun on the left side.  Left thyroid lobe was exposed.  Left lobe was mobilized using the harmonic scalpel.  Dissection inferiorly reveals an abnormal appearing gland in the thyro-thymic tract.  This is gently mobilized.  It appears to be an enlarged parathyroid gland.  It is left in situ for the time being.  Further dissection on the left allows for mobilization of the left thyroid lobe.  Inferior thyroid artery is identified.  Dissection posterior to the superior pole of the thyroid reveals a normal superior parathyroid gland.  At this point we returned to the inferior pole and mobilized the gland which had been previously identified.  Vascular structures are divided between small and medium ligaclips using the harmonic scalpel.  The gland is excised.  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  Neck strap muscles are mobilized off the right thyroid lobe.  Exploration inferiorly fails to reveal any evidence of enlarged parathyroid tissue.  Dissection is carried superiorly.  Inferior thyroid artery is dissected out.  Posterior to the superior pole of the right thyroid lobe is a normal right superior parathyroid gland.  Further exploration inferiorly fails to reveal any normal parathyroid tissue but also there is no evidence of an  enlarged parathyroid gland in the normal locations.  Neck is irrigated with warm saline.  Good hemostasis is achieved.  Fibrillar was placed throughout the operative field.  Strap muscles are reapproximated in the midline with interrupted 3-0 Vicryl sutures.  Platysma is closed with interrupted 3-0 Vicryl sutures.  Skin is anesthetized with local anesthetic.  Skin edges are reapproximated with a running 4-0 Monocryl subcuticular suture.  Wound is washed and dried and Dermabond is applied as dressing.  Patient is awakened from anesthesia and transferred to the recovery room in stable condition.  The patient tolerated the procedure well.   Darnell Level, MD [facility] Surgery Office: [phone]

## 2023-03-27 NOTE — Transfer of Care (Signed)
 Immediate Anesthesia Transfer of Care Note  Patient: Margaret [name]  Procedure(s) Performed: NECK EXPLORATON WITH PARATHYROIDECTOMY  Patient Location: PACU  Anesthesia Type:General  Level of Consciousness: oriented, drowsy, and patient cooperative  Airway & Oxygen Therapy: Patient Spontanous Breathing and Patient connected to face mask oxygen  Post-op Assessment: Report given to RN and Post -op Vital signs reviewed and stable  Post vital signs: Reviewed and stable  Last Vitals:  Vitals Value Taken Time  BP 135/93 03/27/23 0915  Temp    Pulse 89 03/27/23 0916  Resp 14 03/27/23 0916  SpO2 100 % 03/27/23 0916  Vitals shown include unfiled device data.  Last Pain:  Vitals:   03/27/23 0627  TempSrc: Oral  PainSc: 0-No pain         Complications: No notable events documented.

## 2023-03-27 NOTE — Anesthesia Procedure Notes (Signed)
 Procedure Name: Intubation Date/Time: 03/27/2023 7:33 AM  Performed by: Garth Bigness, CRNAPre-anesthesia Checklist: Patient identified, Emergency Drugs available, Suction available and Patient being monitored Patient Re-evaluated:Patient Re-evaluated prior to induction Oxygen Delivery Method: Circle system utilized Preoxygenation: Pre-oxygenation with 100% oxygen Induction Type: IV induction Ventilation: Mask ventilation without difficulty Laryngoscope Size: Mac and 3 Grade View: Grade I Tube type: Oral Tube size: 7.0 mm Number of attempts: 1 Airway Equipment and Method: Stylet Placement Confirmation: ETT inserted through vocal cords under direct vision, positive ETCO2 and breath sounds checked- equal and bilateral Secured at: 22 cm Tube secured with: Tape Dental Injury: Teeth and Oropharynx as per pre-operative assessment

## 2023-03-27 NOTE — Anesthesia Postprocedure Evaluation (Signed)
 Anesthesia Post Note  Patient: Margaret [name]  Procedure(s) Performed: NECK EXPLORATON WITH PARATHYROIDECTOMY     Patient location during evaluation: PACU Anesthesia Type: General Level of consciousness: awake and alert Pain management: pain level controlled Vital Signs Assessment: post-procedure vital signs reviewed and stable Respiratory status: spontaneous breathing, nonlabored ventilation and respiratory function stable Cardiovascular status: blood pressure returned to baseline and stable Postop Assessment: no apparent nausea or vomiting Anesthetic complications: no   No notable events documented.  Last Vitals:  Vitals:   03/27/23 0945 03/27/23 1000  BP: 133/65 135/78  Pulse: 87 85  Resp: (!) 9 16  Temp:    SpO2: 99% 98%    Last Pain:  Vitals:   03/27/23 1000  TempSrc:   PainSc: 4                  Lowella Curb

## 2023-03-27 NOTE — Interval H&P Note (Signed)
 History and Physical Interval Note:  03/27/2023 7:05 AM  Margaret [name] Seen  has presented today for surgery, with the diagnosis of PRIMARY HYPERPARATHYROIDISM.  The various methods of treatment have been discussed with the patient and family. After consideration of risks, benefits and other options for treatment, the patient has consented to    Procedure(s): NECK EXPLORATON WITH PARATHYROIDECTOMY (N/A) as a surgical intervention.    The patient's history has been reviewed, patient examined, no change in status, stable for surgery.  I have reviewed the patient's chart and labs.  Questions were answered to the patient's satisfaction.    Darnell Level, MD [facility] practice Office: [phone]   Darnell Level

## 2023-03-28 ENCOUNTER — Encounter (HOSPITAL_COMMUNITY): Payer: Self-pay | Admitting: Surgery

## 2023-03-28 DIAGNOSIS — F419 Anxiety disorder, unspecified: Secondary | ICD-10-CM | POA: Diagnosis not present

## 2023-03-28 DIAGNOSIS — I10 Essential (primary) hypertension: Secondary | ICD-10-CM | POA: Diagnosis not present

## 2023-03-28 DIAGNOSIS — E210 Primary hyperparathyroidism: Secondary | ICD-10-CM | POA: Diagnosis not present

## 2023-03-28 DIAGNOSIS — Z79899 Other long term (current) drug therapy: Secondary | ICD-10-CM | POA: Diagnosis not present

## 2023-03-28 DIAGNOSIS — E041 Nontoxic single thyroid nodule: Secondary | ICD-10-CM | POA: Diagnosis not present

## 2023-03-28 DIAGNOSIS — K5900 Constipation, unspecified: Secondary | ICD-10-CM | POA: Diagnosis not present

## 2023-03-28 DIAGNOSIS — F32A Depression, unspecified: Secondary | ICD-10-CM | POA: Diagnosis not present

## 2023-03-28 LAB — SURGICAL PATHOLOGY

## 2023-03-28 LAB — CALCIUM: Calcium: 9.2 mg/dL (ref 8.9–10.3)

## 2023-03-28 MED ORDER — TRAMADOL HCL 50 MG PO TABS
50.0000 mg | ORAL_TABLET | Freq: Four times a day (QID) | ORAL | 0 refills | Status: AC | PRN
Start: 2023-03-28 — End: ?

## 2023-03-28 NOTE — Discharge Instructions (Signed)

## 2023-03-28 NOTE — Progress Notes (Signed)
 Discharge instructions given to patient and all questions were answered.

## 2023-03-28 NOTE — Discharge Summary (Signed)
    Physician Discharge Summary   Patient ID: Margaret [name] MRN: [id number] DOB/AGE: 02-12-1970 53 y.o.  Admit date: 03/27/2023  Discharge date: 03/28/2023  Discharge Diagnoses:  Principal Problem:   Hyperparathyroidism, primary [facility]) Active Problems:   Hypercalcemia   Primary hyperparathyroidism [facility])   Discharged Condition: good  Hospital Course: Patient was admitted for observation following neck exploration and parathyroidectomy.  Post op course was uncomplicated.  Pain was well controlled.  Tolerated diet.  Post op calcium level on morning following surgery was 9.2  mg/dl.  Patient was prepared for discharge home on POD#1.  Consults: None  Treatments: surgery: neck exploration and parathyroidectomy  Discharge Exam: Blood pressure 115/88, pulse 91, temperature 97.8 F (36.6 C), temperature source Oral, resp. rate 18, height 5\' 2"  (1.575 m), weight 71.7 kg, SpO2 100%. HEENT - clear Neck - wound dry and intact; Dermabond in place; voice normal  Disposition: Home  Discharge Instructions     Diet - low sodium heart healthy   Complete by: As directed    Increase activity slowly   Complete by: As directed    No dressing needed   Complete by: As directed       Allergies as of 03/28/2023   No Known Allergies      Medication List     TAKE these medications    ALPRAZolam 1 MG tablet Commonly known as: XANAX Take 1 mg by mouth 4 (four) times daily as needed for anxiety or sleep. Anxiety.   buPROPion 300 MG 24 hr tablet Commonly known as: WELLBUTRIN XL Take 300 mg by mouth daily.   famotidine 20 MG tablet Commonly known as: PEPCID Take 20 mg by mouth daily.   losartan-hydrochlorothiazide 100-25 MG tablet Commonly known as: HYZAAR Take 1 tablet by mouth daily.   rosuvastatin 10 MG tablet Commonly known as: CRESTOR Take 10 mg by mouth daily.   Sleep-Aid 50 MG Caps Generic drug: diphenhydrAMINE HCl (Sleep) Take 50 mg by mouth at bedtime as needed  (sleep).   traMADol 50 MG tablet Commonly known as: ULTRAM Take 1 tablet (50 mg total) by mouth every 6 (six) hours as needed for moderate pain (pain score 4-6).               Discharge Care Instructions  (From admission, onward)           Start     Ordered   03/28/23 0000  No dressing needed        03/28/23 8295            Follow-up Information     Darnell Level, MD. Schedule an appointment as soon as possible for a visit in 3 week(s).   Specialty: General Surgery Why: For wound re-check Contact information: [address] [place] [address] [phone]                 Darnell Level, MD [facility] Office: [phone]   Signed: Darnell Level 03/28/2023, 7:23 AM

## 2023-04-10 ENCOUNTER — Ambulatory Visit: Payer: 59 | Admitting: "Endocrinology

## 2023-04-10 DIAGNOSIS — E213 Hyperparathyroidism, unspecified: Secondary | ICD-10-CM | POA: Diagnosis not present

## 2023-04-10 DIAGNOSIS — Z9089 Acquired absence of other organs: Secondary | ICD-10-CM | POA: Diagnosis not present

## 2023-04-10 DIAGNOSIS — Z9889 Other specified postprocedural states: Secondary | ICD-10-CM | POA: Diagnosis not present

## 2023-04-21 ENCOUNTER — Telehealth: Payer: Self-pay | Admitting: "Endocrinology

## 2023-04-21 NOTE — Telephone Encounter (Signed)
 Pt had surgery, does she need repeat labs? Her Appt is 3/31

## 2023-04-21 NOTE — Telephone Encounter (Signed)
 Noted.

## 2023-05-05 ENCOUNTER — Ambulatory Visit: Payer: 59 | Admitting: "Endocrinology

## 2023-05-05 ENCOUNTER — Encounter: Payer: Self-pay | Admitting: "Endocrinology

## 2023-05-05 DIAGNOSIS — E210 Primary hyperparathyroidism: Secondary | ICD-10-CM

## 2023-05-05 DIAGNOSIS — M8589 Other specified disorders of bone density and structure, multiple sites: Secondary | ICD-10-CM | POA: Diagnosis not present

## 2023-05-05 NOTE — Progress Notes (Signed)
 05/05/2023, 6:01 PM   Endocrinology follow-up note  Subjective:    Patient ID: Margaret [name], female    DOB: [birth year], PCP Assunta Found, MD   Past Medical History:  Diagnosis Date   Anxiety    Depression    Hypercalcemia    Hypercalcemia    Hyperlipidemia    Hypertension    Vitamin D deficiency    Past Surgical History:  Procedure Laterality Date   ABDOMINAL HYSTERECTOMY     CHOLECYSTECTOMY     FINGER SURGERY Right 10/2019   middle finger   PARATHYROIDECTOMY N/A 03/27/2023   Procedure: NECK EXPLORATON WITH PARATHYROIDECTOMY;  Surgeon: Darnell Level, MD;  Location: [facility]S;  Service: General;  Laterality: N/A;   TUBAL LIGATION  03/1996   Social History   Socioeconomic History   Marital status: Married    Spouse name: Not on file   Number of children: Not on file   Years of education: Not on file   Highest education level: Not on file  Occupational History   Not on file  Tobacco Use   Smoking status: Every Day    Current packs/day: 0.50    Types: Cigarettes   Smokeless tobacco: Not on file  Vaping Use   Vaping status: Every Day  Substance and Sexual Activity   Alcohol use: Yes    Comment: occasionally- monthly   Drug use: No   Sexual activity: Not on file  Other Topics Concern   Not on file  Social History Narrative   Not on file   Social Drivers of Health   Financial Resource Strain: Not on file  Food Insecurity: No Food Insecurity (03/27/2023)   Hunger Vital Sign    Worried About Running Out of Food in the Last Year: Never true    Ran Out of Food in the Last Year: Never true  Transportation Needs: No Transportation Needs (03/27/2023)   PRAPARE - Transportation    Lack of Transportation (Medical): No    Lack of Transportation (Non-Medical): No  Physical Activity: Not on file  Stress: Not on file  Social Connections: Moderately Isolated (03/27/2023)   Social Connection and Isolation Panel  [NHANES]    Frequency of Communication with Friends and Family: More than three times a week    Frequency of Social Gatherings with Friends and Family: Twice a week    Attends Religious Services: Never    Database administrator or Organizations: No    Attends Engineer, structural: Never    Marital Status: Married   Family History  Problem Relation Age of Onset   COPD Mother    Hyperlipidemia Mother    Osteoporosis Mother    Hypertension Father    Outpatient Encounter Medications as of 05/05/2023  Medication Sig   ALPRAZolam (XANAX) 1 MG tablet Take 1 mg by mouth 4 (four) times daily as needed for anxiety or sleep. Anxiety.   buPROPion (WELLBUTRIN XL) 300 MG 24 hr tablet Take 300 mg by mouth daily.   diphenhydrAMINE HCl, Sleep, (SLEEP-AID) 50 MG CAPS Take 50 mg by mouth at bedtime as needed (sleep).   famotidine (PEPCID) 20 MG tablet Take 20 mg by mouth daily.   losartan-hydrochlorothiazide (HYZAAR) 100-25 MG  tablet Take 1 tablet by mouth daily.   rosuvastatin (CRESTOR) 10 MG tablet Take 10 mg by mouth daily.   traMADol (ULTRAM) 50 MG tablet Take 1 tablet (50 mg total) by mouth every 6 (six) hours as needed for moderate pain (pain score 4-6).   No facility-administered encounter medications on file as of 05/05/2023.   ALLERGIES: No Known Allergies  VACCINATION STATUS:  There is no immunization history on file for this patient.  HPI Margaret [name] is 53 y.o. female who presents today with a medical history as above. she is being seen in follow-up after she was seen consultation for hypercalcemia requested by Assunta Found, MD.   Patient reports that she has history of hypercalcemia for approximately 10 years.  She denies any prior history of nephrolithiasis, osteoporosis, nor cardiac dysrhythmia.  She is not on calcium supplements.    After appropriate work up confirmed primary hyperparathyroidism , she was referred  to Dr. Gerrit Friends for parathyroidectomy. She underwent left  inferior parathyroidectomy.  Prior to her surgery DXA showed osteopenia.   Her post surgery labs show normal calcium at 9.4 , did not include PTH.  Prior to surgery calcium in the  serum ranged between 11.1-12  mg per DL. Her other medical problems include hypertension and hyperlipidemia.  She is on rosuvastatin, losartan and bupropion. She does not have acute complaints today.  She is a former smoker.   Review of Systems  Constitutional: +mildly fluctuating body weight, no fatigue, no subjective hyperthermia, no subjective hypothermia Eyes: no blurry vision, no xerophthalmia ENT: no sore throat, no nodules palpated in throat, no dysphagia/odynophagia, no hoarseness   Objective:       05/05/2023    3:02 PM 03/28/2023    6:11 AM 03/28/2023    2:10 AM  Vitals with BMI  Height 5\' 2"     Weight 164 lbs 6 oz    BMI 30.06    Systolic 114 115 161  Diastolic 80 88 58  Pulse 96 91 90    BP 114/80   Pulse 96   Ht 5\' 2"  (1.575 m)   Wt 164 lb 6.4 oz (74.6 kg)   BMI 30.07 kg/m   Wt Readings from Last 3 Encounters:  05/05/23 164 lb 6.4 oz (74.6 kg)  03/27/23 158 lb (71.7 kg)  03/17/23 158 lb (71.7 kg)    Physical Exam  Constitutional:  Body mass index is 30.07 kg/m.,  not in acute distress, normal state of mind Eyes: PERRLA, EOMI, no exophthalmos ENT: moist mucous membranes, no gross thyromegaly, no gross cervical lymphadenopathy   CMP ( most recent) CMP     Component Value Date/Time   NA 139 03/17/2023 1409   NA 140 12/20/2022 1250   K 3.6 03/17/2023 1409   CL 106 03/17/2023 1409   CO2 25 03/17/2023 1409   GLUCOSE 88 03/17/2023 1409   BUN 13 03/17/2023 1409   BUN 11 12/20/2022 1250   CREATININE 1.08 (H) 03/17/2023 1409   CALCIUM 9.2 03/28/2023 0504   PROT 6.9 12/20/2022 1250   ALBUMIN 4.3 12/20/2022 1250   AST 18 12/20/2022 1250   ALT 24 12/20/2022 1250   ALKPHOS 86 12/20/2022 1250   BILITOT 0.3 12/20/2022 1250   EGFR 65 12/20/2022 1250   GFRNONAA >60  03/17/2023 1409   11/30/2022 labs show calcium 12 mg/dl  Recent Results (from the past 2160 hours)  Basic metabolic panel per protocol     Status: Abnormal   Collection Time: 03/17/23  2:09  PM  Result Value Ref Range   Sodium 139 135 - 145 mmol/L   Potassium 3.6 3.5 - 5.1 mmol/L   Chloride 106 98 - 111 mmol/L   CO2 25 22 - 32 mmol/L   Glucose, Bld 88 70 - 99 mg/dL    Comment: Glucose reference range applies only to samples taken after fasting for at least 8 hours.   BUN 13 6 - 20 mg/dL   Creatinine, Ser 6.21 (H) 0.44 - 1.00 mg/dL   Calcium 30.8 8.9 - 65.7 mg/dL   GFR, Estimated >84 >69 mL/min    Comment: (NOTE) Calculated using the CKD-EPI Creatinine Equation (2021)    Anion gap 8 5 - 15    Comment: Performed at [facility], [address] [place], [place] [address]  CBC per protocol     Status: None   Collection Time: 03/17/23  2:09 PM  Result Value Ref Range   WBC 7.8 4.0 - 10.5 K/uL   RBC 4.42 3.87 - 5.11 MIL/uL   Hemoglobin 12.5 12.0 - 15.0 g/dL   HCT 84.1 32.4 - 40.1 %   MCV 85.1 80.0 - 100.0 fL   MCH 28.3 26.0 - 34.0 pg   MCHC 33.2 30.0 - 36.0 g/dL   RDW 02.7 25.3 - 66.4 %   Platelets 265 150 - 400 K/uL   nRBC 0.0 0.0 - 0.2 %    Comment: Performed at [facility], [address] [place], [place] [address]  Surgical pathology     Status: None   Collection Time: 03/27/23  8:12 AM  Result Value Ref Range   SURGICAL PATHOLOGY      SURGICAL PATHOLOGY CASE: [id number] PATIENT: [name] Surgical Pathology Report     Clinical History: Primary hyperparathyroidism (las)     FINAL MICROSCOPIC DIAGNOSIS:  A. LEFT INFERIOR PARATHYROID, ADENOMA, PARATHYROIDECTOMY: Hypercellular parathyroid compatible with adenoma Adjacent benign thymic tissue   INTRAOPERATIVE DIAGNOSIS:  A.  ?  Left inferior parathyroid adenoma: "Hypercellular parathyroid" Intraoperative diagnosis rendered by Dr. Kenyon Ana at 8:30 AM  on 03/27/2023.  GROSS DESCRIPTION:  Specimen is received fresh for rapid intraoperative consultation and consists of a 0.482 g, 1.6 x 1.1 x 0.6 cm nodular portion of tan-red soft tissue.  Specimen is bisected, and entirely submitted for frozen section analysis.  The remnant is resubmitted for permanent in 1 cassette.  Lovey Newcomer 03/27/2023)   Final Diagnosis performed by Jerene Bears, MD.   Electronically signed 03/28/2023 Technical component performed at [facility],  [address] [place], [place] [address].  Professional component performed at [employer]. [facility], [address] [place], [place] [address].  Immunohistochemistry Technical component (if applicable) was performed at [facility]. [address], [address], [place], [place] [address].   IMMUNOHISTOCHEMISTRY DISCLAIMER (if applicable): Some of these immunohistochemical stains may have been developed and the performance characteristics determine by [facility]. Some may not have been cleared or approved by the U.S. Food and Drug Administration. The FDA has determined that such clearance or approval is not necessary. This test is used for clinical purposes. It should not be regarded as investigational or for research. This laboratory is certified under the Clinical Laboratory Improvement Amendments of 1988 (CLIA-88) as qualified to perform high complexity clinical laboratory testing.  The controls stained appropriately.   IHC sta ins are performed on formalin fixed, paraffin embedded tissue using a 3,3"diaminobenzidine (DAB) chromogen and Leica Bond Autostainer System. The staining intensity  of the nucleus is score manually and is reported as the percentage of tumor cell nuclei demonstrating specific nuclear staining. The specimens are fixed in 10% Neutral Formalin for at least 6 hours and up to 72hrs. These tests are validated on decalcified tissue.  Results should be interpreted with caution given the possibility of false negative results on decalcified specimens. Antibody Clones are as follows ER-clone 2F, PR-clone 16, Ki67- clone MM1. Some of these immunohistochemical stains may have been developed and the performance characteristics determined by [facility].   Calcium     Status: None   Collection Time: 03/28/23  5:04 AM  Result Value Ref Range   Calcium 9.2 8.9 - 10.3 mg/dL    Comment: Performed at [facility], [address] [place], [place] [address]     Bone density on November 22, 2022 Lumbar spine was excluded due to DJD. DualFemur Total Left 11/22/2022 51.9 Osteopenia -1.9 0.773 g/cm2 - -   DualFemur Total Mean 11/22/2022 51.9 Osteopenia -1.7 0.788 g/cm2 - -   Right Forearm Radius 33% 11/22/2022 51.9 Osteopenia -1.2 0.630 g/cm2  Assessment & Plan:   1. Hypercalcemia- resolved  2.  Primary hyperparathyroidism- s/p left inferion parathyroidectomy - adenoma  She is s/p surgery, recovering well with normalization of calcium at 9.4.  She will not need any supplements . She will return in 14 months with labs and repeat DXA given her osteopenia pre-surgery.  - she is advised to maintain close follow up with Assunta Found, MD for primary care needs.   I spent  21 minutes in the care of the patient today including review of labs from Thyroid Function, CMP, and other relevant labs ; imaging/biopsy records (current and previous including abstractions from other facilities); face-to-face time discussing  her lab results and symptoms, medications doses, her options of short and long term treatment based on the latest standards of care / guidelines;   and documenting the encounter.  [name]  participated in the discussions, expressed understanding, and voiced agreement with the above plans.  All questions were answered to her satisfaction. she is encouraged to contact clinic should she have  any questions or concerns prior to her return visit.   Follow up plan: Return in about 20 months (around 01/03/2025) for F/U with Pre-visit Labs, DXA Scan B4 NV.   Marquis Lunch, MD [facility] [address] [place], [place] [address] Phone: [phone]  Fax: [phone]     05/05/2023, 6:01 PM  This note was partially dictated with voice recognition software. Similar sounding words can be transcribed inadequately or may not  be corrected upon review.

## 2023-06-12 DIAGNOSIS — E663 Overweight: Secondary | ICD-10-CM | POA: Diagnosis not present

## 2023-06-12 DIAGNOSIS — F419 Anxiety disorder, unspecified: Secondary | ICD-10-CM | POA: Diagnosis not present

## 2023-06-12 DIAGNOSIS — Z6827 Body mass index (BMI) 27.0-27.9, adult: Secondary | ICD-10-CM | POA: Diagnosis not present

## 2023-08-11 ENCOUNTER — Telehealth: Admitting: Physician Assistant

## 2023-08-11 DIAGNOSIS — B9689 Other specified bacterial agents as the cause of diseases classified elsewhere: Secondary | ICD-10-CM

## 2023-08-11 DIAGNOSIS — J019 Acute sinusitis, unspecified: Secondary | ICD-10-CM

## 2023-08-11 MED ORDER — AMOXICILLIN-POT CLAVULANATE 875-125 MG PO TABS: 1.0000 | ORAL_TABLET | Freq: Two times a day (BID) | ORAL | 0 refills | Status: AC

## 2023-08-11 NOTE — Progress Notes (Signed)
 Virtual Visit Consent   Margaret [name], you are scheduled for a virtual visit with a [facility] provider today. Just as with appointments in the office, your consent must be obtained to participate. Your consent will be active for this visit and any virtual visit you may have with one of our providers in the next 365 days. If you have a MyChart account, a copy of this consent can be sent to you electronically.  As this is a virtual visit, video technology does not allow for your provider to perform a traditional examination. This may limit your provider's ability to fully assess your condition. If your provider identifies any concerns that need to be evaluated in person or the need to arrange testing (such as labs, EKG, etc.), we will make arrangements to do so. Although advances in technology are sophisticated, we cannot ensure that it will always work on either your end or our end. If the connection with a video visit is poor, the visit may have to be switched to a telephone visit. With either a video or telephone visit, we are not always able to ensure that we have a secure connection.  By engaging in this virtual visit, you consent to the provision of healthcare and authorize for your insurance to be billed (if applicable) for the services provided during this visit. Depending on your insurance coverage, you may receive a charge related to this service.  I need to obtain your verbal consent now. Are you willing to proceed with your visit today? Margaret [name] has provided verbal consent on 08/11/2023 for a virtual visit (video or telephone). Margaret CHRISTELLA Dickinson, PA-C  Date: 08/11/2023 10:42 AM   Virtual Visit via Video Note   I, Margaret [name], connected with  Margaret [name]  ([id number], Feb 17, 1970) on 08/11/23 at 10:45 AM EDT by a video-enabled telemedicine application and verified that I am speaking with the correct person using two identifiers.  Location: Patient: Virtual Visit  Location Patient: Home Provider: Virtual Visit Location Provider: Home Office   I discussed the limitations of evaluation and management by telemedicine and the availability of in person appointments. The patient expressed understanding and agreed to proceed.    History of Present Illness: Margaret [name] is a 53 y.o. who identifies as a female who was assigned female at birth, and is being seen today for sinus congestion.  HPI: Sinusitis This is a new problem. The current episode started 1 to 4 weeks ago (over a week). The problem has been gradually worsening since onset. There has been no fever. The pain is moderate. Associated symptoms include congestion, coughing (mild at night), headaches and sinus pressure. Pertinent negatives include no chills, diaphoresis, ear pain, hoarse voice, neck pain, sneezing or sore throat. (Fluttering feeling in ears with fluid and pressure, post nasal drainage) Treatments tried: claritin, mucinex sinus. The treatment provided no relief.    Problems:  Patient Active Problem List   Diagnosis Date Noted   Osteopenia of multiple sites 05/05/2023   Primary hyperparathyroidism (HCC) 03/27/2023   Hypercalcemia 12/12/2022   Hyperparathyroidism, primary (HCC) 12/12/2022    Allergies: No Known Allergies Medications:  Current Outpatient Medications:    amoxicillin -clavulanate (AUGMENTIN ) 875-125 MG tablet, Take 1 tablet by mouth 2 (two) times daily., Disp: 14 tablet, Rfl: 0   ALPRAZolam  (XANAX ) 1 MG tablet, Take 1 mg by mouth 4 (four) times daily as needed for anxiety or sleep. Anxiety., Disp: , Rfl:    buPROPion  (WELLBUTRIN  XL) 300 MG 24 hr tablet,  Take 300 mg by mouth daily., Disp: , Rfl:    diphenhydrAMINE  HCl, Sleep, (SLEEP-AID) 50 MG CAPS, Take 50 mg by mouth at bedtime as needed (sleep)., Disp: , Rfl:    famotidine  (PEPCID ) 20 MG tablet, Take 20 mg by mouth daily., Disp: , Rfl:    losartan -hydrochlorothiazide  (HYZAAR) 100-25 MG tablet, Take 1 tablet by mouth  daily., Disp: , Rfl:    rosuvastatin (CRESTOR) 10 MG tablet, Take 10 mg by mouth daily., Disp: , Rfl:    traMADol  (ULTRAM ) 50 MG tablet, Take 1 tablet (50 mg total) by mouth every 6 (six) hours as needed for moderate pain (pain score 4-6)., Disp: 12 tablet, Rfl: 0  Observations/Objective: Patient is well-developed, well-nourished in no acute distress.  Resting comfortably at home.  Head is normocephalic, atraumatic.  No labored breathing.  Speech is clear and coherent with logical content.  Patient is alert and oriented at baseline.    Assessment and Plan: 1. Acute bacterial sinusitis (Primary) - amoxicillin -clavulanate (AUGMENTIN ) 875-125 MG tablet; Take 1 tablet by mouth 2 (two) times daily.  Dispense: 14 tablet; Refill: 0  - Worsening symptoms that have not responded to OTC medications.  - Will give Augmentin  - Continue allergy medications.  - Steam and humidifier can help - Stay well hydrated and get plenty of rest.  - Seek in person evaluation if no symptom improvement or if symptoms worsen   Follow Up Instructions: I discussed the assessment and treatment plan with the patient. The patient was provided an opportunity to ask questions and all were answered. The patient agreed with the plan and demonstrated an understanding of the instructions.  A copy of instructions were sent to the patient via MyChart unless otherwise noted below.    The patient was advised to call back or seek an in-person evaluation if the symptoms worsen or if the condition fails to improve as anticipated.    Margaret CHRISTELLA Dickinson, PA-C

## 2023-08-11 NOTE — Patient Instructions (Signed)
 Margaret [name], thank you for joining Delon CHRISTELLA Dickinson, PA-C for today's virtual visit.  While this provider is not your primary care provider (PCP), if your PCP is located in our provider database this encounter information will be shared with them immediately following your visit.   A [facility] MyChart account gives you access to today's visit and all your visits, tests, and labs performed at [facility]  click here if you don't have a [facility] MyChart account or go to [email or web address]  Consent: (Patient) Pamelia Botto provided verbal consent for this virtual visit at the beginning of the encounter.  Current Medications:  Current Outpatient Medications:    amoxicillin -clavulanate (AUGMENTIN ) 875-125 MG tablet, Take 1 tablet by mouth 2 (two) times daily., Disp: 14 tablet, Rfl: 0   ALPRAZolam  (XANAX ) 1 MG tablet, Take 1 mg by mouth 4 (four) times daily as needed for anxiety or sleep. Anxiety., Disp: , Rfl:    buPROPion  (WELLBUTRIN  XL) 300 MG 24 hr tablet, Take 300 mg by mouth daily., Disp: , Rfl:    diphenhydrAMINE  HCl, Sleep, (SLEEP-AID) 50 MG CAPS, Take 50 mg by mouth at bedtime as needed (sleep)., Disp: , Rfl:    famotidine  (PEPCID ) 20 MG tablet, Take 20 mg by mouth daily., Disp: , Rfl:    losartan -hydrochlorothiazide  (HYZAAR) 100-25 MG tablet, Take 1 tablet by mouth daily., Disp: , Rfl:    rosuvastatin (CRESTOR) 10 MG tablet, Take 10 mg by mouth daily., Disp: , Rfl:    traMADol  (ULTRAM ) 50 MG tablet, Take 1 tablet (50 mg total) by mouth every 6 (six) hours as needed for moderate pain (pain score 4-6)., Disp: 12 tablet, Rfl: 0   Medications ordered in this encounter:  Meds ordered this encounter  Medications   amoxicillin -clavulanate (AUGMENTIN ) 875-125 MG tablet    Sig: Take 1 tablet by mouth 2 (two) times daily.    Dispense:  14 tablet    Refill:  0    Supervising Provider:   BLAISE ALEENE KIDD [[id number]]     *If you need refills on other medications  prior to your next appointment, please contact your pharmacy*  Follow-Up: Call back or seek an in-person evaluation if the symptoms worsen or if the condition fails to improve as anticipated.  [facility] [phone]  Other Instructions Sinus Infection, Adult A sinus infection, also called sinusitis, is inflammation of your sinuses. Sinuses are hollow spaces in the bones around your face. Your sinuses are located: Around your eyes. In the middle of your forehead. Behind your nose. In your cheekbones. Mucus normally drains out of your sinuses. When your nasal tissues become inflamed or swollen, mucus can become trapped or blocked. This allows bacteria, viruses, and fungi to grow, which leads to infection. Most infections of the sinuses are caused by a virus. A sinus infection can develop quickly. It can last for up to 4 weeks (acute) or for more than 12 weeks (chronic). A sinus infection often develops after a cold. What are the causes? This condition is caused by anything that creates swelling in the sinuses or stops mucus from draining. This includes: Allergies. Asthma. Infection from bacteria or viruses. Deformities or blockages in your nose or sinuses. Abnormal growths in the nose (nasal polyps). Pollutants, such as chemicals or irritants in the air. Infection from fungi. This is rare. What increases the risk? You are more likely to develop this condition if you: Have a weak body defense system (immune system). Do a lot of swimming  or diving. Overuse nasal sprays. Smoke. What are the signs or symptoms? The main symptoms of this condition are pain and a feeling of pressure around the affected sinuses. Other symptoms include: Stuffy nose or congestion that makes it difficult to breathe through your nose. Thick yellow or greenish drainage from your nose. Tenderness, swelling, and warmth over the affected sinuses. A cough that may get worse at night. Decreased  sense of smell and taste. Extra mucus that collects in the throat or the back of the nose (postnasal drip) causing a sore throat or bad breath. Tiredness (fatigue). Fever. How is this diagnosed? This condition is diagnosed based on: Your symptoms. Your medical history. A physical exam. Tests to find out if your condition is acute or chronic. This may include: Checking your nose for nasal polyps. Viewing your sinuses using a device that has a light (endoscope). Testing for allergies or bacteria. Imaging tests, such as an MRI or CT scan. In rare cases, a bone biopsy may be done to rule out more serious types of fungal sinus disease. How is this treated? Treatment for a sinus infection depends on the cause and whether your condition is chronic or acute. If caused by a virus, your symptoms should go away on their own within 10 days. You may be given medicines to relieve symptoms. They include: Medicines that shrink swollen nasal passages (decongestants). A spray that eases inflammation of the nostrils (topical intranasal corticosteroids). Rinses that help get rid of thick mucus in your nose (nasal saline washes). Medicines that treat allergies (antihistamines). Over-the-counter pain relievers. If caused by bacteria, your health care provider may recommend waiting to see if your symptoms improve. Most bacterial infections will get better without antibiotic medicine. You may be given antibiotics if you have: A severe infection. A weak immune system. If caused by narrow nasal passages or nasal polyps, surgery may be needed. Follow these instructions at home: Medicines Take, use, or apply over-the-counter and prescription medicines only as told by your health care provider. These may include nasal sprays. If you were prescribed an antibiotic medicine, take it as told by your health care provider. Do not stop taking the antibiotic even if you start to feel better. Hydrate and humidify  Drink  enough fluid to keep your urine pale yellow. Staying hydrated will help to thin your mucus. Use a cool mist humidifier to keep the humidity level in your home above 50%. Inhale steam for 10-15 minutes, 3-4 times a day, or as told by your health care provider. You can do this in the bathroom while a hot shower is running. Limit your exposure to cool or dry air. Rest Rest as much as possible. Sleep with your head raised (elevated). Make sure you get enough sleep each night. General instructions  Apply a warm, moist washcloth to your face 3-4 times a day or as told by your health care provider. This will help with discomfort. Use nasal saline washes as often as told by your health care provider. Wash your hands often with soap and water  to reduce your exposure to germs. If soap and water  are not available, use hand sanitizer. Do not smoke. Avoid being around people who are smoking (secondhand smoke). Keep all follow-up visits. This is important. Contact a health care provider if: You have a fever. Your symptoms get worse. Your symptoms do not improve within 10 days. Get help right away if: You have a severe headache. You have persistent vomiting. You have severe pain or  swelling around your face or eyes. You have vision problems. You develop confusion. Your neck is stiff. You have trouble breathing. These symptoms may be an emergency. Get help right away. Call 911. Do not wait to see if the symptoms will go away. Do not drive yourself to the hospital. Summary A sinus infection is soreness and inflammation of your sinuses. Sinuses are hollow spaces in the bones around your face. This condition is caused by nasal tissues that become inflamed or swollen. The swelling traps or blocks the flow of mucus. This allows bacteria, viruses, and fungi to grow, which leads to infection. If you were prescribed an antibiotic medicine, take it as told by your health care provider. Do not stop taking the  antibiotic even if you start to feel better. Keep all follow-up visits. This is important. This information is not intended to replace advice given to you by your health care provider. Make sure you discuss any questions you have with your health care provider. Document Revised: 12/26/2020 Document Reviewed: 12/26/2020 Elsevier Patient Education  2024 [employer]   If you have been instructed to have an in-person evaluation today at a local Urgent Care facility, please use the link below. It will take you to a list of all of our available [facility], including address, phone number and hours of operation. Please do not delay care.  [facility]  If you or a family member do not have a primary care provider, use the link below to schedule a visit and establish care. When you choose a [facility] primary care physician or advanced practice provider, you gain a long-term partner in health. Find a Primary Care Provider  Learn more about [facility]'s in-office and virtual care options: [facility] - Get Care Now

## 2023-09-16 DIAGNOSIS — F419 Anxiety disorder, unspecified: Secondary | ICD-10-CM | POA: Diagnosis not present

## 2023-09-16 DIAGNOSIS — M8580 Other specified disorders of bone density and structure, unspecified site: Secondary | ICD-10-CM | POA: Diagnosis not present

## 2023-09-16 DIAGNOSIS — E210 Primary hyperparathyroidism: Secondary | ICD-10-CM | POA: Diagnosis not present

## 2023-09-16 DIAGNOSIS — F17200 Nicotine dependence, unspecified, uncomplicated: Secondary | ICD-10-CM | POA: Diagnosis not present

## 2023-09-16 DIAGNOSIS — I10 Essential (primary) hypertension: Secondary | ICD-10-CM | POA: Diagnosis not present

## 2023-09-16 DIAGNOSIS — E041 Nontoxic single thyroid nodule: Secondary | ICD-10-CM | POA: Diagnosis not present

## 2023-09-16 DIAGNOSIS — Z0000 Encounter for general adult medical examination without abnormal findings: Secondary | ICD-10-CM | POA: Diagnosis not present

## 2023-09-16 DIAGNOSIS — E785 Hyperlipidemia, unspecified: Secondary | ICD-10-CM | POA: Diagnosis not present

## 2023-12-18 DIAGNOSIS — E785 Hyperlipidemia, unspecified: Secondary | ICD-10-CM | POA: Diagnosis not present

## 2023-12-18 DIAGNOSIS — Z23 Encounter for immunization: Secondary | ICD-10-CM | POA: Diagnosis not present

## 2023-12-18 DIAGNOSIS — I10 Essential (primary) hypertension: Secondary | ICD-10-CM | POA: Diagnosis not present

## 2023-12-18 DIAGNOSIS — F17200 Nicotine dependence, unspecified, uncomplicated: Secondary | ICD-10-CM | POA: Diagnosis not present

## 2024-12-20 ENCOUNTER — Ambulatory Visit: Admitting: "Endocrinology
